# Patient Record
Sex: Male | Born: 1985 | Race: White | Hispanic: No | Marital: Married | Smoking: Former smoker
Health system: Southern US, Community
[De-identification: ages and names within clinical notes are randomized; demographics above are authoritative.]

## PROBLEM LIST (undated history)

## (undated) DIAGNOSIS — N390 Urinary tract infection, site not specified: Secondary | ICD-10-CM

## (undated) HISTORY — PX: NO PAST SURGERIES: SHX2092

## (undated) HISTORY — PX: VASECTOMY: SHX75

---

## 2008-05-25 ENCOUNTER — Emergency Department: Payer: Self-pay | Admitting: Emergency Medicine

## 2011-09-06 ENCOUNTER — Ambulatory Visit: Payer: Self-pay | Admitting: Gastroenterology

## 2021-01-12 ENCOUNTER — Other Ambulatory Visit: Payer: Self-pay

## 2021-01-12 ENCOUNTER — Ambulatory Visit
Admission: EM | Admit: 2021-01-12 | Discharge: 2021-01-12 | Disposition: A | Discharge status: Home / Self Care | Payer: 59 | Attending: Emergency Medicine | Admitting: Emergency Medicine

## 2021-01-12 ENCOUNTER — Encounter: Payer: Self-pay | Admitting: Emergency Medicine

## 2021-01-12 DIAGNOSIS — J020 Streptococcal pharyngitis: Secondary | ICD-10-CM | POA: Insufficient documentation

## 2021-01-12 LAB — GROUP A STREP BY PCR: Group A Strep by PCR: DETECTED — AB

## 2021-01-12 MED ORDER — AMOXICILLIN-POT CLAVULANATE 875-125 MG PO TABS: 1.0000 | ORAL_TABLET | Freq: Two times a day (BID) | ORAL | 0 refills | Status: DC

## 2021-01-12 NOTE — ED Provider Notes (Signed)
[facility]    CSN: [id number] Arrival date & time: 01/12/21  1827      History   Chief Complaint Chief Complaint  Patient presents with  . Sore Throat  . Lymphadenopathy    HPI Douglas Banks is a 35 y.o. male.   HPI   35 year old male here for evaluation of sore throat and swollen glands.  Patient reports that his sore throat started earlier today.  His wife tested positive for strep 2 days ago and had a negative Covid test at that time.  Patient states that he has not had any fever, runny nose, ear pain or pressure, cough, shortness of breath, or GI complaints.  History reviewed. No pertinent past medical history.  There are no problems to display for this patient.   Past Surgical History:  Procedure Laterality Date  . NO PAST SURGERIES         Home Medications    Prior to Admission medications   Medication Sig Start Date End Date Taking? Authorizing Provider  amoxicillin-clavulanate (AUGMENTIN) 875-125 MG tablet Take 1 tablet by mouth every 12 (twelve) hours. 01/12/21  Yes Becky Augusta, NP    Family History Family History  Problem Relation Age of Onset  . Diabetes Mother   . Other Father        unknown medical history    Social History Social History   Tobacco Use  . Smoking status: Former Smoker    Years: 10.00    Types: Cigarettes    Quit date: 01/12/2017    Years since quitting: 4.0  . Smokeless tobacco: Never Used  . Tobacco comment: 1 pack a week  Vaping Use  . Vaping Use: Former  . Quit date: 01/13/2020  Substance Use Topics  . Alcohol use: Yes    Comment: social  . Drug use: Never     Allergies   Patient has no known allergies.   Review of Systems Review of Systems  Constitutional: Negative for activity change, appetite change and fever.  HENT: Positive for sore throat. Negative for congestion, ear pain and rhinorrhea.   Respiratory: Negative for cough and shortness of breath.   Gastrointestinal: Negative for  diarrhea, nausea and vomiting.  Skin: Negative for rash.  Hematological: Negative.   Psychiatric/Behavioral: Negative.      Physical Exam Triage Vital Signs ED Triage Vitals  Enc Vitals Group     BP 01/12/21 1849 (!) 132/93     Pulse Rate 01/12/21 1849 (!) 107     Resp 01/12/21 1849 18     Temp 01/12/21 1849 98.8 F (37.1 C)     Temp Source 01/12/21 1849 Oral     SpO2 01/12/21 1849 99 %     Weight 01/12/21 1850 230 lb (104.3 kg)     Height 01/12/21 1850 6\' 3"  (1.905 m)     Head Circumference --      Peak Flow --      Pain Score 01/12/21 1849 4     Pain Loc --      Pain Edu? --      Excl. in GC? --    No data found.  Updated Vital Signs BP (!) 132/93 (BP Location: Left Arm)   Pulse (!) 107   Temp 98.8 F (37.1 C) (Oral)   Resp 18   Ht 6\' 3"  (1.905 m)   Wt 230 lb (104.3 kg)   SpO2 99%   BMI 28.75 kg/m   Visual Acuity Right Eye Distance:  Left Eye Distance:   Bilateral Distance:    Right Eye Near:   Left Eye Near:    Bilateral Near:     Physical Exam Vitals and nursing note reviewed.  Constitutional:      General: He is not in acute distress.    Appearance: He is well-developed. He is not ill-appearing.  HENT:     Right Ear: Tympanic membrane and ear canal normal. Tympanic membrane is not erythematous.     Left Ear: Tympanic membrane and ear canal normal. Tympanic membrane is not erythematous.     Mouth/Throat:     Mouth: Mucous membranes are moist.     Pharynx: Oropharynx is clear. Posterior oropharyngeal erythema present. No oropharyngeal exudate.     Tonsils: No tonsillar exudate. 2+ on the right. 2+ on the left.  Cardiovascular:     Rate and Rhythm: Normal rate and regular rhythm.     Heart sounds: Normal heart sounds. No murmur heard. No gallop.   Pulmonary:     Effort: Pulmonary effort is normal.     Breath sounds: Normal breath sounds. No wheezing, rhonchi or rales.  Musculoskeletal:     Cervical back: Normal range of motion and neck supple.   Lymphadenopathy:     Cervical: Cervical adenopathy present.  Skin:    General: Skin is warm and dry.     Capillary Refill: Capillary refill takes less than 2 seconds.     Findings: No erythema or rash.  Neurological:     General: No focal deficit present.     Mental Status: He is alert and oriented to person, place, and time.  Psychiatric:        Mood and Affect: Mood normal.        Behavior: Behavior normal.      UC Treatments / Results  Labs (all labs ordered are listed, but only abnormal results are displayed) Labs Reviewed  GROUP A STREP BY PCR - Abnormal; Notable for the following components:      Result Value   Group A Strep by PCR DETECTED (*)    All other components within normal limits    EKG   Radiology No results found.  Procedures Procedures (including critical care time)  Medications Ordered in UC Medications - No data to display  Initial Impression / Assessment and Plan / UC Course  I have reviewed the triage vital signs and the nursing notes.  Pertinent labs & imaging results that were available during my care of the patient were reviewed by me and considered in my medical decision making (see chart for details).   Patient is here for evaluation of sore throat and swollen glands that started on her today.  Patient's wife tested positive for strep 2 days ago.  Physical exam reveals erythematous and edematous tonsillar pillars without exudate.  Patient does have cervical some lymphadenopathy on exam.  Will send strep PCR.  Strep PCR is positive.  We will treat patient with Augmentin twice daily for 7 days, salt water gargles, over-the-counter Advil, and supportive care.   Final Clinical Impressions(s) / UC Diagnoses   Final diagnoses:  Strep throat     Discharge Instructions     Take the Augmentin twice daily with food for 7 days for treatment of your strep throat.  Use over-the-counter Tylenol Advil as needed for pain and fever.  Gargle  with warm salt water 2-3 times a day to aid in healing.  Return for reevaluation, or follow-up with your primary  care provider, for any new or worsening symptoms.    ED Prescriptions    Medication Sig Dispense Auth. Provider   amoxicillin-clavulanate (AUGMENTIN) 875-125 MG tablet Take 1 tablet by mouth every 12 (twelve) hours. 14 tablet Becky Augusta, NP     PDMP not reviewed this encounter.   Becky Augusta, NP 01/12/21 1925

## 2021-01-12 NOTE — Discharge Instructions (Addendum)
Take the Augmentin twice daily with food for 7 days for treatment of your strep throat.  Use over-the-counter Tylenol Advil as needed for pain and fever.  Gargle with warm salt water 2-3 times a day to aid in healing.  Return for reevaluation, or follow-up with your primary care provider, for any new or worsening symptoms.

## 2021-01-12 NOTE — ED Triage Notes (Signed)
Patient in today c/o sore throat and swollen glands x today. Patient states his wife tested positive for strep on 01/10/21. Patient has had the covid vaccines, no booster. Patient has had covid twice the last being Mid-Jan 2022.

## 2021-02-17 ENCOUNTER — Other Ambulatory Visit: Payer: Self-pay

## 2021-02-17 ENCOUNTER — Ambulatory Visit
Admission: RE | Admit: 2021-02-17 | Discharge: 2021-02-17 | Disposition: A | Discharge status: Home / Self Care | Payer: 59 | Source: Ambulatory Visit | Attending: Family Medicine | Admitting: Family Medicine

## 2021-02-17 VITALS — BP 124/88 | HR 97 | Temp 98.3°F | Resp 18 | Ht 75.0 in | Wt 229.9 lb

## 2021-02-17 DIAGNOSIS — M545 Low back pain, unspecified: Secondary | ICD-10-CM

## 2021-02-17 LAB — CHLAMYDIA/NGC RT PCR (ARMC ONLY)
Chlamydia Tr: NOT DETECTED
N gonorrhoeae: NOT DETECTED

## 2021-02-17 LAB — HIV ANTIBODY (ROUTINE TESTING W REFLEX): HIV Screen 4th Generation wRfx: NONREACTIVE

## 2021-02-17 MED ORDER — TIZANIDINE HCL 4 MG PO TABS: 4.0000 mg | ORAL_TABLET | Freq: Three times a day (TID) | ORAL | 0 refills | Status: DC | PRN

## 2021-02-17 MED ORDER — MELOXICAM 15 MG PO TABS: 15.0000 mg | ORAL_TABLET | Freq: Every day | ORAL | 0 refills | Status: DC | PRN

## 2021-02-17 NOTE — ED Provider Notes (Signed)
[facility]    CSN: [id number] Arrival date & time: 02/17/21  1636      History   Chief Complaint Chief Complaint  Patient presents with  . Back Pain   HPI  35 year old male presents with low back pain.  1 week history of low back pain.  Started after he returned to the gym after a long layoff due to COVID-19.  No fall, trauma, or direct injury.  He has been using ice and heat without resolution.  Pain is 7/10 in severity.  Worse with range of motion.  Heat does help but does not resolve his problem.  Likely exacerbated by recent change in activity.  No other complaints at this time.  Home Medications    Prior to Admission medications   Medication Sig Start Date End Date Taking? Authorizing Provider  meloxicam (MOBIC) 15 MG tablet Take 1 tablet (15 mg total) by mouth daily as needed for pain. 02/17/21  Yes Tiyah Zelenak G, DO  tiZANidine (ZANAFLEX) 4 MG tablet Take 1 tablet (4 mg total) by mouth every 8 (eight) hours as needed for muscle spasms. 02/17/21  Yes Tommie Sams, DO    Family History Family History  Problem Relation Age of Onset  . Diabetes Mother   . Other Father        unknown medical history    Social History Social History   Tobacco Use  . Smoking status: Former Smoker    Years: 10.00    Types: Cigarettes    Quit date: 01/12/2017    Years since quitting: 4.1  . Smokeless tobacco: Never Used  . Tobacco comment: 1 pack a week  Vaping Use  . Vaping Use: Former  . Quit date: 01/13/2020  Substance Use Topics  . Alcohol use: Yes    Comment: social  . Drug use: Never     Allergies   Patient has no known allergies.   Review of Systems Review of Systems Per HPI  Physical Exam Triage Vital Signs ED Triage Vitals  Enc Vitals Group     BP 02/17/21 1648 124/88     Pulse Rate 02/17/21 1648 97     Resp 02/17/21 1648 18     Temp 02/17/21 1648 98.3 F (36.8 C)     Temp Source 02/17/21 1648 Oral     SpO2 02/17/21 1648 100 %     Weight  02/17/21 1647 229 lb 15 oz (104.3 kg)     Height 02/17/21 1647 6\' 3"  (1.905 m)     Head Circumference --      Peak Flow --      Pain Score 02/17/21 1647 7     Pain Loc --      Pain Edu? --      Excl. in GC? --    Updated Vital Signs BP 124/88 (BP Location: Left Arm)   Pulse 97   Temp 98.3 F (36.8 C) (Oral)   Resp 18   Ht 6\' 3"  (1.905 m)   Wt 104.3 kg   SpO2 100%   BMI 28.74 kg/m   Visual Acuity Right Eye Distance:   Left Eye Distance:   Bilateral Distance:    Right Eye Near:   Left Eye Near:    Bilateral Near:     Physical Exam Vitals and nursing note reviewed.  Constitutional:      General: He is not in acute distress.    Appearance: Normal appearance. He is not ill-appearing.  HENT:  Head: Normocephalic and atraumatic.  Cardiovascular:     Rate and Rhythm: Normal rate.  Pulmonary:     Effort: Pulmonary effort is normal.     Breath sounds: Normal breath sounds. No wheezing, rhonchi or rales.  Musculoskeletal:     Comments: Left paraspinal muscle spasm noted in the lumbar region.  Decreased range of motion.  Neurological:     Mental Status: He is alert.  Psychiatric:        Mood and Affect: Mood normal.        Behavior: Behavior normal.    UC Treatments / Results  Labs (all labs ordered are listed, but only abnormal results are displayed) Labs Reviewed  CHLAMYDIA/NGC RT PCR ([facility] ONLY)  RPR  HIV ANTIBODY (ROUTINE TESTING W REFLEX)  HCV AB W REFLEX TO QUANT PCR    EKG   Radiology No results found.  Procedures Procedures (including critical care time)  Medications Ordered in UC Medications - No data to display  Initial Impression / Assessment and Plan / UC Course  I have reviewed the triage vital signs and the nursing notes.  Pertinent labs & imaging results that were available during my care of the patient were reviewed by me and considered in my medical decision making (see chart for details).    35 year old male presents  with acute low back pain.  Treating with Zanaflex and meloxicam.  Supportive care.  Final Clinical Impressions(s) / UC Diagnoses   Final diagnoses:  Acute left-sided low back pain without sciatica     Discharge Instructions     Rest.  Heat.  Medications as prescribed.  Take care  Dr. Adriana Simas    ED Prescriptions    Medication Sig Dispense Auth. Provider   tiZANidine (ZANAFLEX) 4 MG tablet Take 1 tablet (4 mg total) by mouth every 8 (eight) hours as needed for muscle spasms. 30 tablet Garrison Michie G, DO   meloxicam (MOBIC) 15 MG tablet Take 1 tablet (15 mg total) by mouth daily as needed for pain. 30 tablet Tommie Sams, DO     PDMP not reviewed this encounter.   Tommie Sams, [place] 02/17/21 [phone]

## 2021-02-17 NOTE — ED Triage Notes (Signed)
Pt c/o lower back pain. Started about a week ago. He states he had went to the gym the day before the pain started but he only did cardio. He has tried otc pain meds and alternating heat and ice.

## 2021-02-17 NOTE — Discharge Instructions (Signed)
Rest.  Heat.  Medications as prescribed.  Take care  Dr. Linzy Laury  

## 2021-02-18 ENCOUNTER — Telehealth: Payer: Self-pay | Admitting: Student in an Organized Health Care Education/Training Program

## 2021-02-18 ENCOUNTER — Emergency Department
Admission: EM | Admit: 2021-02-18 | Discharge: 2021-02-18 | Disposition: A | Discharge status: Home / Self Care | Payer: 59 | Attending: Emergency Medicine | Admitting: Emergency Medicine

## 2021-02-18 ENCOUNTER — Other Ambulatory Visit: Payer: Self-pay

## 2021-02-18 ENCOUNTER — Emergency Department: Payer: 59

## 2021-02-18 ENCOUNTER — Encounter: Payer: Self-pay | Admitting: Emergency Medicine

## 2021-02-18 DIAGNOSIS — R109 Unspecified abdominal pain: Secondary | ICD-10-CM | POA: Diagnosis present

## 2021-02-18 DIAGNOSIS — Z87891 Personal history of nicotine dependence: Secondary | ICD-10-CM | POA: Insufficient documentation

## 2021-02-18 DIAGNOSIS — N12 Tubulo-interstitial nephritis, not specified as acute or chronic: Secondary | ICD-10-CM

## 2021-02-18 DIAGNOSIS — N10 Acute tubulo-interstitial nephritis: Secondary | ICD-10-CM | POA: Insufficient documentation

## 2021-02-18 DIAGNOSIS — N281 Cyst of kidney, acquired: Secondary | ICD-10-CM | POA: Insufficient documentation

## 2021-02-18 DIAGNOSIS — N200 Calculus of kidney: Secondary | ICD-10-CM | POA: Insufficient documentation

## 2021-02-18 LAB — URINALYSIS, COMPLETE (UACMP) WITH MICROSCOPIC
Bilirubin Urine: NEGATIVE
Glucose, UA: NEGATIVE mg/dL
Ketones, ur: 20 mg/dL — AB
Nitrite: NEGATIVE
Protein, ur: 100 mg/dL — AB
RBC / HPF: >50 RBC/hpf — ABNORMAL HIGH (ref 0–5)
Specific Gravity, Urine: 1.025 (ref 1.005–1.030)
WBC, UA: >50 WBC/hpf — ABNORMAL HIGH (ref 0–5)
pH: 5 (ref 5.0–8.0)

## 2021-02-18 LAB — CHLAMYDIA/NGC RT PCR (ARMC ONLY)
Chlamydia Tr: NOT DETECTED
N gonorrhoeae: NOT DETECTED

## 2021-02-18 LAB — CBC WITH DIFFERENTIAL/PLATELET
Abs Immature Granulocytes: 0.04 10*3/uL (ref 0.00–0.07)
Basophils Absolute: 0 10*3/uL (ref 0.0–0.1)
Basophils Relative: 0 %
Eosinophils Absolute: 0 10*3/uL (ref 0.0–0.5)
Eosinophils Relative: 0 %
HCT: 42 % (ref 39.0–52.0)
Hemoglobin: 14.1 g/dL (ref 13.0–17.0)
Immature Granulocytes: 0 %
Lymphocytes Relative: 12 %
Lymphs Abs: 1.7 10*3/uL (ref 0.7–4.0)
MCH: 29.1 pg (ref 26.0–34.0)
MCHC: 33.6 g/dL (ref 30.0–36.0)
MCV: 86.8 fL (ref 80.0–100.0)
Monocytes Absolute: 1.1 10*3/uL — ABNORMAL HIGH (ref 0.1–1.0)
Monocytes Relative: 8 %
Neutro Abs: 11.2 10*3/uL — ABNORMAL HIGH (ref 1.7–7.7)
Neutrophils Relative %: 80 %
Platelets: 191 10*3/uL (ref 150–400)
RBC: 4.84 MIL/uL (ref 4.22–5.81)
RDW: 12.8 % (ref 11.5–15.5)
WBC: 14.1 10*3/uL — ABNORMAL HIGH (ref 4.0–10.5)
nRBC: 0 % (ref 0.0–0.2)

## 2021-02-18 LAB — COMPREHENSIVE METABOLIC PANEL
ALT: 25 U/L (ref 0–44)
AST: 18 U/L (ref 15–41)
Albumin: 4.3 g/dL (ref 3.5–5.0)
Alkaline Phosphatase: 53 U/L (ref 38–126)
Anion gap: 8 (ref 5–15)
BUN: 17 mg/dL (ref 6–20)
CO2: 22 mmol/L (ref 22–32)
Calcium: 8.9 mg/dL (ref 8.9–10.3)
Chloride: 105 mmol/L (ref 98–111)
Creatinine, Ser: 1.08 mg/dL (ref 0.61–1.24)
GFR, Estimated: >60 mL/min (ref >60)
Glucose, Bld: 197 mg/dL — ABNORMAL HIGH (ref 70–99)
Potassium: 3.7 mmol/L (ref 3.5–5.1)
Sodium: 135 mmol/L (ref 135–145)
Total Bilirubin: 1.9 mg/dL — ABNORMAL HIGH (ref 0.3–1.2)
Total Protein: 7.8 g/dL (ref 6.5–8.1)

## 2021-02-18 LAB — HCV INTERPRETATION

## 2021-02-18 LAB — RPR: RPR Ser Ql: NONREACTIVE

## 2021-02-18 LAB — HCV AB W REFLEX TO QUANT PCR: HCV Ab: <0.1 s/co ratio (ref 0.0–0.9)

## 2021-02-18 LAB — HEMOGLOBIN A1C
Hgb A1c MFr Bld: 5.7 % — ABNORMAL HIGH (ref 4.8–5.6)
Mean Plasma Glucose: 116.89 mg/dL

## 2021-02-18 MED ORDER — ONDANSETRON 4 MG PO TBDP: 4.0000 mg | ORAL_TABLET | Freq: Four times a day (QID) | ORAL | 0 refills | Status: DC | PRN

## 2021-02-18 MED ORDER — HYDROCODONE-ACETAMINOPHEN 5-325 MG PO TABS: 1.0000 | ORAL_TABLET | ORAL | 0 refills | Status: DC | PRN

## 2021-02-18 MED ORDER — SODIUM CHLORIDE 0.9 % IV SOLN
1.0000 g | Freq: Once | INTRAVENOUS | Status: AC
  Administered 2021-02-18: 1 g via INTRAVENOUS
  Filled 2021-02-18: qty 10

## 2021-02-18 MED ORDER — SODIUM CHLORIDE 0.9 % IV BOLUS (SEPSIS)
1000.0000 mL | Freq: Once | INTRAVENOUS | Status: AC
  Administered 2021-02-18: 1000 mL via INTRAVENOUS

## 2021-02-18 MED ORDER — IOHEXOL 300 MG/ML  SOLN
125.0000 mL | Freq: Once | INTRAMUSCULAR | Status: AC | PRN
  Administered 2021-02-18: 125 mL via INTRAVENOUS

## 2021-02-18 MED ORDER — ONDANSETRON HCL 4 MG/2ML IJ SOLN
4.0000 mg | Freq: Once | INTRAMUSCULAR | Status: AC
  Administered 2021-02-18: 4 mg via INTRAVENOUS
  Filled 2021-02-18: qty 2

## 2021-02-18 MED ORDER — CEFPODOXIME PROXETIL 200 MG PO TABS: 200.0000 mg | ORAL_TABLET | Freq: Two times a day (BID) | ORAL | 0 refills | Status: DC

## 2021-02-18 MED ORDER — OXYCODONE-ACETAMINOPHEN 5-325 MG PO TABS: 1.0000 | ORAL_TABLET | ORAL | 0 refills | Status: DC | PRN

## 2021-02-18 MED ORDER — KETOROLAC TROMETHAMINE 30 MG/ML IJ SOLN
30.0000 mg | Freq: Once | INTRAMUSCULAR | Status: AC
  Administered 2021-02-18: 30 mg via INTRAVENOUS
  Filled 2021-02-18: qty 1

## 2021-02-18 NOTE — ED Notes (Signed)
Patient provided with ice water per physician request

## 2021-02-18 NOTE — Discharge Instructions (Signed)
You may alternate Tylenol 1000 mg every 6 hours as needed for pain, fever and Ibuprofen 800 mg every 8 hours as needed for pain, fever.  Please take Ibuprofen with food.  Do not take more than 4000 mg of Tylenol (acetaminophen) in a 24 hour period.  Please do not take ibuprofen if you are taking meloxicam.  I recommend that you stop taking your tizanidine.  Please take your antibiotics until complete.

## 2021-02-18 NOTE — ED Notes (Signed)
Patient transported to CT with technician in wheelchair   

## 2021-02-18 NOTE — Telephone Encounter (Signed)
Was contacted by [facility] stating that they are on back order for Norco therefore prescription switched to Percocet

## 2021-02-18 NOTE — ED Provider Notes (Addendum)
[facility] Emergency Department Provider Note  ____________________________________________   Event Date/Time   First MD Initiated Contact with Patient 02/18/21 0401     (approximate)  I have reviewed the triage vital signs and the nursing notes.   HISTORY  Chief Complaint Flank Pain    HPI Douglas Banks is a 35 y.o. male with no significant past medical history who presents to the emergency department left flank pain for the past week.  States he was seen in urgent care and started on tizanidine and meloxicam without much relief.  States he had a fever tonight of 101.8 which prompted him to come to the ED.  Has had nausea without vomiting or diarrhea.  Has had dysuria without hematuria or penile discharge.  No history of previous kidney stones.  No abdominal pain.  No injury to his back.        History reviewed. No pertinent past medical history.  There are no problems to display for this patient.   Past Surgical History:  Procedure Laterality Date  . NO PAST SURGERIES      Prior to Admission medications   Medication Sig Start Date End Date Taking? Authorizing Provider  cefpodoxime (VANTIN) 200 MG tablet Take 1 tablet (200 mg total) by mouth 2 (two) times daily. 02/18/21  Yes Margart Zemanek, Layla Maw, DO  HYDROcodone-acetaminophen (NORCO/VICODIN) 5-325 MG tablet Take 1 tablet by mouth every 4 (four) hours as needed. 02/18/21  Yes Jacek Colson N, DO  ondansetron (ZOFRAN ODT) 4 MG disintegrating tablet Take 1 tablet (4 mg total) by mouth every 6 (six) hours as needed for nausea or vomiting. 02/18/21  Yes Jeannelle Wiens, Layla Maw, DO  meloxicam (MOBIC) 15 MG tablet Take 1 tablet (15 mg total) by mouth daily as needed for pain. 02/17/21   Tommie Sams, DO  tiZANidine (ZANAFLEX) 4 MG tablet Take 1 tablet (4 mg total) by mouth every 8 (eight) hours as needed for muscle spasms. 02/17/21   Tommie Sams, DO    Allergies Patient has no known allergies.  Family  History  Problem Relation Age of Onset  . Diabetes Mother   . Other Father        unknown medical history    Social History Social History   Tobacco Use  . Smoking status: Former Smoker    Years: 10.00    Types: Cigarettes    Quit date: 01/12/2017    Years since quitting: 4.1  . Smokeless tobacco: Never Used  . Tobacco comment: 1 pack a week  Vaping Use  . Vaping Use: Some days  . Last attempt to quit: 01/13/2020  Substance Use Topics  . Alcohol use: Yes    Comment: social  . Drug use: Never    Review of Systems Constitutional: +  fever. Eyes: No visual changes. ENT: No sore throat. Cardiovascular: Denies chest pain. Respiratory: Denies shortness of breath. Gastrointestinal: No vomiting, diarrhea. Genitourinary: + dysuria. Musculoskeletal: Negative for back pain. Skin: Negative for rash. Neurological: Negative for focal weakness or numbness.  ____________________________________________   PHYSICAL EXAM:  VITAL SIGNS: ED Triage Vitals [02/18/21 0212]  Enc Vitals Group     BP 106/86     Pulse Rate (!) 102     Resp 18     Temp 98.6 F (37 C)     Temp Source Oral     SpO2 100 %     Weight 230 lb (104.3 kg)     Height 6\' 3"  (1.905  m)     Head Circumference      Peak Flow      Pain Score 6     Pain Loc      Pain Edu?      Excl. in GC?    CONSTITUTIONAL: Alert and oriented and responds appropriately to questions. Well-appearing; well-nourished, nontoxic-appearing, well-hydrated HEAD: Normocephalic EYES: Conjunctivae clear, pupils appear equal, EOM appear intact ENT: normal nose; moist mucous membranes NECK: Supple, normal ROM CARD: RRR; S1 and S2 appreciated; no murmurs, no clicks, no rubs, no gallops RESP: Normal chest excursion without splinting or tachypnea; breath sounds clear and equal bilaterally; no wheezes, no rhonchi, no rales, no hypoxia or respiratory distress, speaking full sentences ABD/GI: Normal bowel sounds; non-distended; soft, non-tender,  no rebound, no guarding, no peritoneal signs, no hepatosplenomegaly BACK: The back appears normal, no midline spinal tenderness or step-off or deformity, + left CVA tenderness on exam EXT: Normal ROM in all joints; no deformity noted, no edema; no cyanosis SKIN: Normal color for age and race; warm; no rash on exposed skin NEURO: Moves all extremities equally PSYCH: The patient's mood and manner are appropriate.  ____________________________________________   LABS (all labs ordered are listed, but only abnormal results are displayed)  Labs Reviewed  CBC WITH DIFFERENTIAL/PLATELET - Abnormal; Notable for the following components:      Result Value   WBC 14.1 (*)    Neutro Abs 11.2 (*)    Monocytes Absolute 1.1 (*)    All other components within normal limits  COMPREHENSIVE METABOLIC PANEL - Abnormal; Notable for the following components:   Glucose, Bld 197 (*)    Total Bilirubin 1.9 (*)    All other components within normal limits  URINALYSIS, COMPLETE (UACMP) WITH MICROSCOPIC - Abnormal; Notable for the following components:   Color, Urine AMBER (*)    APPearance HAZY (*)    Hgb urine dipstick LARGE (*)    Ketones, ur 20 (*)    Protein, ur 100 (*)    Leukocytes,Ua MODERATE (*)    RBC / HPF >50 (*)    WBC, UA >50 (*)    Bacteria, UA MANY (*)    All other components within normal limits  URINE CULTURE  CHLAMYDIA/NGC RT PCR ([facility] ONLY)   ____________________________________________  EKG  none ____________________________________________  RADIOLOGY I, Rachelle Edwards, personally viewed and evaluated these images (plain radiographs) as part of my medical decision making, as well as reviewing the written report by the radiologist.  ED MD interpretation: No ureteral stone.  Official radiology report(s): CT ABDOMEN PELVIS W CONTRAST  Result Date: 02/18/2021 CLINICAL DATA:  Flank pain, kidney stone suspected Concern for possible pyelonephritis versus infected stone  EXAM: CT ABDOMEN AND PELVIS WITH CONTRAST TECHNIQUE: Multidetector CT imaging of the abdomen and pelvis was performed using the standard protocol following bolus administration of intravenous contrast. CONTRAST:  125mL OMNIPAQUE IOHEXOL 300 MG/ML  SOLN COMPARISON:  X-ray abdomen 09/06/2011 FINDINGS: Lower chest: No acute abnormality. Hepatobiliary: Subcentimeter hypodensities are too small to characterize. Vague hypodensity along the false form ligament likely represents focal fatty infiltration. No gallstones, gallbladder wall thickening, or pericholecystic fluid. No biliary dilatation. Pancreas: No focal lesion. Normal pancreatic contour. No surrounding inflammatory changes. No main pancreatic ductal dilatation. Spleen: Normal in size without focal abnormality. Adrenals/Urinary Tract: No adrenal nodule bilaterally. Bilateral kidneys enhance symmetrically. There is a 2.3 cm fluid density lesion within left kidney likely represents a simple renal cyst. Bilateral 1-2 mm calcified stones. Subcentimeter hypodensities are too  small to characterize. No hydronephrosis. No hydroureter. The urinary bladder is unremarkable. Stomach/Bowel: Stomach is within normal limits. No evidence of bowel wall thickening or dilatation. Stool throughout the ascending and transverse colon. Appendix appears normal. Vascular/Lymphatic: No significant vascular findings are present. No enlarged abdominal or pelvic lymph nodes. Reproductive: Prostate is unremarkable. Other: No intraperitoneal free fluid. No intraperitoneal free gas. No organized fluid collection. Musculoskeletal: No abdominal wall hernia or abnormality. No suspicious lytic or blastic osseous lesions. No acute displaced fracture. Multilevel degenerative changes of the spine. Posterior disc osteophyte formation at the L5-S1 level. IMPRESSION: 1. Nonobstructive bilateral 1-2 mm nephrolithiasis. 2. A 2.3 cm fluid density lesion within the left kidney with associated surrounding  trace fat stranding of unclear etiology. Electronically Signed   By: Tish Frederickson M.D.   On: 02/18/2021 05:15    ____________________________________________   PROCEDURES  Procedure(s) performed (including Critical Care):  Procedures   ____________________________________________   INITIAL IMPRESSION / ASSESSMENT AND PLAN / ED COURSE  As part of my medical decision making, I reviewed the following data within the electronic MEDICAL RECORD NUMBER History obtained from family, Nursing notes reviewed and incorporated, Labs reviewed , Old chart reviewed, Notes from prior ED visits and [place] Controlled Substance Database         Patient here with left-sided flank pain, dysuria and fever.  Labs obtained in triage show leukocytosis 14,000 with left shift.  Creatinine normal.  Urine shows moderate leukocytes, greater than 50 red blood cells, greater than 50 white blood cells and many bacteria.  Concern for pyelonephritis versus infected kidney stone.  We will proceed with CT imaging with IV contrast.  Will give IV fluids, Toradol, Zofran, ceftriaxone.  Urine culture pending.  ED PROGRESS  CT scan shows nonobstructing bilateral nephrolithiasis but no ureterolithiasis, hydronephrosis.  He also has a left-sided renal cyst with some fat stranding.  I suspect that patient has pyelonephritis.  Given he is still well-appearing, tolerating p.o. and feeling much better, I feel it is reasonable to discharge him home on oral antibiotics.  He is also comfortable with this plan.  Have advised that he stop taking tizanidine but can take meloxicam as needed.  Will discharge with prescription of Vicodin, Zofran, Cefpodoxime.  At this time, I do not feel there is any life-threatening condition present. I have reviewed, interpreted and discussed all results (EKG, imaging, lab, urine as appropriate) and exam findings with patient/family. I have reviewed nursing notes and appropriate previous records.  I feel the  patient is safe to be discharged home without further emergent workup and can continue workup as an outpatient as needed. Discussed usual and customary return precautions. Patient/family verbalize understanding and are comfortable with this plan.  Outpatient follow-up has been provided as needed. All questions have been answered.   6:30 AM  Pt's wife is a lab tech mildly elevated glucose.  He has been eating and drinking prior to his arrival to the ED.  They requested a hemoglobin A1c be added onto their labs that have already been drawn and they will follow up with these results through MyChart and with their PCP.  They understand that these results are not going to be managed by the ED providers. ____________________________________________   FINAL CLINICAL IMPRESSION(S) / ED DIAGNOSES  Final diagnoses:  Pyelonephritis     ED Discharge Orders         Ordered    cefpodoxime (VANTIN) 200 MG tablet  2 times daily  02/18/21 0544    ondansetron (ZOFRAN ODT) 4 MG disintegrating tablet  Every 6 hours PRN        02/18/21 0544    HYDROcodone-acetaminophen (NORCO/VICODIN) 5-325 MG tablet  Every 4 hours PRN        02/18/21 0544          *Please note:  Douglas Banks was evaluated in Emergency Department on 02/18/2021 for the symptoms described in the history of present illness. He was evaluated in the context of the global COVID-19 pandemic, which necessitated consideration that the patient might be at risk for infection with the SARS-CoV-2 virus that causes COVID-19. Institutional protocols and algorithms that pertain to the evaluation of patients at risk for COVID-19 are in a state of rapid change based on information released by regulatory bodies including the CDC and federal and state organizations. These policies and algorithms were followed during the patient's care in the ED.  Some ED evaluations and interventions may be delayed as a result of limited staffing during and the  pandemic.*   Note:  This document was prepared using Dragon voice recognition software and may include unintentional dictation errors.   Cynethia Schindler, Layla Maw, DO 02/18/21 0544    Leshawn Houseworth, Layla Maw, DO 02/18/21 [phone]

## 2021-02-18 NOTE — ED Triage Notes (Signed)
Patient ambulatory to triage with steady gait, without difficulty or distress noted; pt reports left flank pain, nonradiating with discomfort upon urinating; seen at [facility] yesterday and dx with muscle strain but was concerned over possible fever tonight; tylenol taken at midnight

## 2021-02-18 NOTE — ED Notes (Signed)
Pt tolerating oral fluids with ease.

## 2021-02-20 ENCOUNTER — Other Ambulatory Visit: Payer: Self-pay

## 2021-02-20 ENCOUNTER — Ambulatory Visit
Admission: EM | Admit: 2021-02-20 | Discharge: 2021-02-20 | Disposition: A | Discharge status: Home / Self Care | Payer: 59 | Attending: Internal Medicine | Admitting: Internal Medicine

## 2021-02-20 DIAGNOSIS — N12 Tubulo-interstitial nephritis, not specified as acute or chronic: Secondary | ICD-10-CM | POA: Diagnosis not present

## 2021-02-20 DIAGNOSIS — N200 Calculus of kidney: Secondary | ICD-10-CM | POA: Diagnosis not present

## 2021-02-20 LAB — CBC WITH DIFFERENTIAL/PLATELET
Abs Immature Granulocytes: 0.03 10*3/uL (ref 0.00–0.07)
Basophils Absolute: 0 10*3/uL (ref 0.0–0.1)
Basophils Relative: 0 %
Eosinophils Absolute: 0 10*3/uL (ref 0.0–0.5)
Eosinophils Relative: 0 %
HCT: 43 % (ref 39.0–52.0)
Hemoglobin: 14.7 g/dL (ref 13.0–17.0)
Immature Granulocytes: 0 %
Lymphocytes Relative: 11 %
Lymphs Abs: 1.2 10*3/uL (ref 0.7–4.0)
MCH: 28.9 pg (ref 26.0–34.0)
MCHC: 34.2 g/dL (ref 30.0–36.0)
MCV: 84.6 fL (ref 80.0–100.0)
Monocytes Absolute: 0.7 10*3/uL (ref 0.1–1.0)
Monocytes Relative: 7 %
Neutro Abs: 8.3 10*3/uL — ABNORMAL HIGH (ref 1.7–7.7)
Neutrophils Relative %: 82 %
Platelets: 163 10*3/uL (ref 150–400)
RBC: 5.08 MIL/uL (ref 4.22–5.81)
RDW: 12.7 % (ref 11.5–15.5)
WBC: 10.2 10*3/uL (ref 4.0–10.5)
nRBC: 0 % (ref 0.0–0.2)

## 2021-02-20 LAB — URINALYSIS, COMPLETE (UACMP) WITH MICROSCOPIC
Bilirubin Urine: NEGATIVE
Glucose, UA: NEGATIVE mg/dL
Nitrite: NEGATIVE
Protein, ur: 100 mg/dL — AB
Specific Gravity, Urine: 1.025 (ref 1.005–1.030)
WBC, UA: >50 WBC/hpf (ref 0–5)
pH: 6 (ref 5.0–8.0)

## 2021-02-20 MED ORDER — TAMSULOSIN HCL 0.4 MG PO CAPS: 0.4000 mg | ORAL_CAPSULE | Freq: Every day | ORAL | 0 refills | Status: DC

## 2021-02-20 NOTE — Discharge Instructions (Addendum)
Urinate through the strainer to catch the stone  and can be analgized at the lab. You mat take it to your future primary care provider to get  the order. I am placing you on Flomax to help you pass the stone, take one a day The infection is getting better, so keep on taking the antibiotic.  I also have a Urologist listing you can follow up next week if the stone does not pass and you are still in pain.

## 2021-02-20 NOTE — ED Triage Notes (Signed)
Pt dx with kidney stones and pyelonephritis by [facility] ED on 03/16.  Has been taking oral abx and oral pain meds.  Fever at home continues to run 101+ today.  No improvement at all with meds.  Reports ongoing fatigue, HA, L flank pain, night sweats, etc.  Pain currently 4 with meds on board but goes to 8/10 without meds.   Had Tylenol approx 1700.

## 2021-02-20 NOTE — ED Provider Notes (Signed)
[facility]    CSN: [id number] Arrival date & time: 02/20/21  1754      History   Chief Complaint Chief Complaint  Patient presents with   Flank Pain    HPI Douglas Banks is a 35 y.o. male who presents due to still having fever at home and L flank pain. Was diagnosed with pyelo and renal stone on 3/16 at [facility] and was given Rocephin IM, and sent home with Vantin antibiotic.  He does not have a PCP yet.   History reviewed. No pertinent past medical history.  There are no problems to display for this patient.   Past Surgical History:  Procedure Laterality Date   NO PAST SURGERIES         Home Medications    Prior to Admission medications   Medication Sig Start Date End Date Taking? Authorizing Provider  meloxicam (MOBIC) 15 MG tablet Take 1 tablet (15 mg total) by mouth daily as needed for pain. 02/17/21  Yes Cook, Jayce G, DO  tamsulosin (FLOMAX) 0.4 MG CAPS capsule Take 1 capsule (0.4 mg total) by mouth daily. 02/20/21  Yes Rodriguez-Southworth, Nettie Elm, PA-C  tiZANidine (ZANAFLEX) 4 MG tablet Take 1 tablet (4 mg total) by mouth every 8 (eight) hours as needed for muscle spasms. 02/17/21  Yes Cook, Jayce G, DO  cefpodoxime (VANTIN) 200 MG tablet Take 1 tablet (200 mg total) by mouth 2 (two) times daily. 02/18/21   Ward, Layla Maw, DO  ondansetron (ZOFRAN ODT) 4 MG disintegrating tablet Take 1 tablet (4 mg total) by mouth every 6 (six) hours as needed for nausea or vomiting. 02/18/21   Ward, Layla Maw, DO  oxyCODONE-acetaminophen (PERCOCET) 5-325 MG tablet Take 1 tablet by mouth every 4 (four) hours as needed for severe pain. 02/18/21 02/18/22  Willy Eddy, MD    Family History Family History  Problem Relation Age of Onset   Diabetes Mother    Other Father        unknown medical history    Social History Social History   Tobacco Use   Smoking status: Former Smoker    Years: 10.00    Types: Cigarettes    Quit date: 01/12/2017    Years since  quitting: 4.1   Smokeless tobacco: Never Used   Tobacco comment: 1 pack a week  Vaping Use   Vaping Use: Some days   Last attempt to quit: 01/13/2020   Substances: Nicotine, Flavoring  Substance Use Topics   Alcohol use: Yes    Comment: social   Drug use: Never     Allergies   Patient has no known allergies.   Review of Systems Review of Systems  Constitutional: Positive for fever.  HENT: Negative for congestion, ear discharge, ear pain, postnasal drip and rhinorrhea.   Respiratory: Negative for cough and chest tightness.   Cardiovascular: Negative for chest pain.  Gastrointestinal: Positive for nausea. Negative for abdominal pain, diarrhea and vomiting.  Genitourinary: Positive for flank pain. Negative for dysuria, frequency and hematuria.  Musculoskeletal: Positive for back pain.  Skin: Negative for rash.     Physical Exam Triage Vital Signs ED Triage Vitals  Enc Vitals Group     BP 02/20/21 1810 124/80     Pulse Rate 02/20/21 1810 (!) 118     Resp 02/20/21 1810 18     Temp 02/20/21 1810 98.8 F (37.1 C)     Temp Source 02/20/21 1810 Oral     SpO2 02/20/21 1810 95 %  Weight --      Height --      Head Circumference --      Peak Flow --      Pain Score 02/20/21 1805 4     Pain Loc --      Pain Edu? --      Excl. in GC? --    No data found.  Updated Vital Signs BP 124/80 (BP Location: Left Arm)    Pulse (!) 118    Temp 98.8 F (37.1 C) (Oral)    Resp 18    SpO2 95%   Visual Acuity Right Eye Distance:   Left Eye Distance:   Bilateral Distance:    Right Eye Near:   Left Eye Near:    Bilateral Near:     Physical Exam Vitals and nursing note reviewed.  Constitutional:      General: He is not in acute distress.    Appearance: He is not toxic-appearing.  HENT:     Head: Normocephalic.     Right Ear: External ear normal.     Left Ear: External ear normal.  Eyes:     General: No scleral icterus.    Conjunctiva/sclera: Conjunctivae normal.   Cardiovascular:     Rate and Rhythm: Regular rhythm.  Abdominal:     General: Bowel sounds are normal.     Palpations: Abdomen is soft.     Tenderness: There is left CVA tenderness. There is no right CVA tenderness.     Comments: Has mild L mid abd pain  Musculoskeletal:        General: Normal range of motion.     Cervical back: Neck supple.  Skin:    General: Skin is warm and dry.     Findings: No rash.  Neurological:     Mental Status: He is alert and oriented to person, place, and time.     Gait: Gait normal.  Psychiatric:        Mood and Affect: Mood normal.        Behavior: Behavior normal.        Thought Content: Thought content normal.        Judgment: Judgment normal.     UC Treatments / Results  Labs (all labs ordered are listed, but only abnormal results are displayed) Labs Reviewed  CBC WITH DIFFERENTIAL/PLATELET - Abnormal; Notable for the following components:      Result Value   Neutro Abs 8.3 (*)    All other components within normal limits  URINALYSIS, COMPLETE (UACMP) WITH MICROSCOPIC - Abnormal; Notable for the following components:   APPearance HAZY (*)    Hgb urine dipstick MODERATE (*)    Ketones, ur TRACE (*)    Protein, ur 100 (*)    Leukocytes,Ua TRACE (*)    Bacteria, UA FEW (*)    All other components within normal limits    EKG   Radiology No results found.  Procedures Procedures (including critical care time)  Medications Ordered in UC Medications - No data to display  Initial Impression / Assessment and Plan / UC Course  I have reviewed the triage vital signs and the nursing notes. Pertinent labs  results that were available during my care of the patient were reviewed by me and considered in my medical decision making (see chart for details). His CBC and UA has improved compared to yesterday. I placed him on Flomax to help him pass the stone. Needs to FU with Urology next week. And  needs to finish the antibiotic he is taking.    Final Clinical Impressions(s) / UC Diagnoses   Final diagnoses:  Pyelonephritis  Renal stone     Discharge Instructions     Urinate through the strainer to catch the stone  and can be analgized at the lab. You mat take it to your future primary care provider to get  the order. I am placing you on Flomax to help you pass the stone, take one a day The infection is getting better, so keep on taking the antibiotic.  I also have a Urologist listing you can follow up next week if the stone does not pass and you are still in pain.     ED Prescriptions    Medication Sig Dispense Auth. Provider   tamsulosin (FLOMAX) 0.4 MG CAPS capsule Take 1 capsule (0.4 mg total) by mouth daily. 30 capsule Rodriguez-Southworth, Nettie Elm, [place]     I have reviewed the PDMP during this encounter.   Garey Ham, [place] 02/20/21 1911

## 2021-02-21 LAB — URINE CULTURE: Culture: >=100000 — AB

## 2021-02-22 NOTE — Progress Notes (Signed)
ED Antimicrobial Stewardship Positive Culture Follow Up   Douglas Banks is an 35 y.o. male who presented to [facility] on 02/18/2021 with a chief complaint of  Chief Complaint  Patient presents with  . Flank Pain    Recent Results (from the past 720 hour(s))  Chlamydia/NGC rt PCR ([facility] only)     Status: None   Collection Time: 02/17/21  5:13 PM   Specimen: Urine  Result Value Ref Range Status   Specimen source GC/Chlam URINE, RANDOM  Final    Comment: Performed at [facility], [address], [place], [place] [address]   Chlamydia Tr NOT DETECTED NOT DETECTED Final   N gonorrhoeae NOT DETECTED NOT DETECTED Final    Comment: (NOTE) This CT/NG assay has not been evaluated in patients with a history of  hysterectomy. Performed at [facility], [address] [place], [place] [address]   Urine Culture     Status: Abnormal   Collection Time: 02/18/21  2:15 AM   Specimen: Urine, Random  Result Value Ref Range Status   Specimen Description   Final    URINE, RANDOM Performed at [facility], [address] [place], [place] [address]    Special Requests   Final    NONE Performed at [facility], [address] [place], [place] [address]    Culture >=100,000 COLONIES/mL ENTEROBACTER AEROGENES (A)  Final   Report Status 02/21/2021 FINAL  Final   Organism ID, Bacteria ENTEROBACTER AEROGENES (A)  Final      Susceptibility   Enterobacter aerogenes - MIC*    CEFAZOLIN >=64 RESISTANT Resistant     CEFEPIME <=0.12 SENSITIVE Sensitive     CEFTRIAXONE 32 RESISTANT Resistant     CIPROFLOXACIN <=0.25 SENSITIVE Sensitive     GENTAMICIN <=1 SENSITIVE Sensitive     IMIPENEM 1 SENSITIVE Sensitive     NITROFURANTOIN 64 INTERMEDIATE Intermediate     TRIMETH/SULFA <=20 SENSITIVE Sensitive     PIP/TAZO >=128 RESISTANT Resistant     * >=100,000 COLONIES/mL ENTEROBACTER AEROGENES  Chlamydia/NGC rt PCR ([facility] only)     Status: None   Collection  Time: 02/18/21  2:15 AM   Specimen: Urine  Result Value Ref Range Status   Specimen source GC/Chlam URINE, RANDOM  Final   Chlamydia Tr NOT DETECTED NOT DETECTED Final   N gonorrhoeae NOT DETECTED NOT DETECTED Final    Comment: (NOTE) This CT/NG assay has not been evaluated in patients with a history of  hysterectomy. Performed at [facility], [address] [place], [place] [address]     [x]  Treated with cefpodoxime, organism resistant to prescribed antimicrobial  New antibiotic prescription: Bactrim 1 DS BID x 14 days  ED Provider: Dr. 02/22/2021, 1:57 PM

## 2021-05-23 ENCOUNTER — Other Ambulatory Visit: Payer: Self-pay

## 2021-05-23 ENCOUNTER — Ambulatory Visit: Admission: EM | Admit: 2021-05-23 | Discharge: 2021-05-23 | Discharge status: Against Medical Advice | Payer: 59

## 2021-05-23 ENCOUNTER — Encounter: Payer: Self-pay | Admitting: Emergency Medicine

## 2021-05-23 ENCOUNTER — Emergency Department
Admission: EM | Admit: 2021-05-23 | Discharge: 2021-05-23 | Disposition: A | Discharge status: Home / Self Care | Payer: 59 | Attending: Emergency Medicine | Admitting: Emergency Medicine

## 2021-05-23 ENCOUNTER — Emergency Department: Payer: 59

## 2021-05-23 DIAGNOSIS — N3001 Acute cystitis with hematuria: Secondary | ICD-10-CM | POA: Diagnosis not present

## 2021-05-23 DIAGNOSIS — M542 Cervicalgia: Secondary | ICD-10-CM | POA: Diagnosis present

## 2021-05-23 DIAGNOSIS — Z87891 Personal history of nicotine dependence: Secondary | ICD-10-CM | POA: Diagnosis not present

## 2021-05-23 DIAGNOSIS — R590 Localized enlarged lymph nodes: Secondary | ICD-10-CM | POA: Diagnosis not present

## 2021-05-23 LAB — COMPREHENSIVE METABOLIC PANEL
ALT: 19 U/L (ref 0–44)
AST: 16 U/L (ref 15–41)
Albumin: 4.2 g/dL (ref 3.5–5.0)
Alkaline Phosphatase: 51 U/L (ref 38–126)
Anion gap: 6 (ref 5–15)
BUN: 14 mg/dL (ref 6–20)
CO2: 27 mmol/L (ref 22–32)
Calcium: 9.5 mg/dL (ref 8.9–10.3)
Chloride: 105 mmol/L (ref 98–111)
Creatinine, Ser: 0.9 mg/dL (ref 0.61–1.24)
GFR, Estimated: >60 mL/min (ref >60)
Glucose, Bld: 104 mg/dL — ABNORMAL HIGH (ref 70–99)
Potassium: 4.1 mmol/L (ref 3.5–5.1)
Sodium: 138 mmol/L (ref 135–145)
Total Bilirubin: 0.9 mg/dL (ref 0.3–1.2)
Total Protein: 8.2 g/dL — ABNORMAL HIGH (ref 6.5–8.1)

## 2021-05-23 LAB — CBC WITH DIFFERENTIAL/PLATELET
Abs Immature Granulocytes: 0.03 10*3/uL (ref 0.00–0.07)
Basophils Absolute: 0.1 10*3/uL (ref 0.0–0.1)
Basophils Relative: 1 %
Eosinophils Absolute: 0 10*3/uL (ref 0.0–0.5)
Eosinophils Relative: 1 %
HCT: 41.6 % (ref 39.0–52.0)
Hemoglobin: 13.8 g/dL (ref 13.0–17.0)
Immature Granulocytes: 0 %
Lymphocytes Relative: 23 %
Lymphs Abs: 1.9 10*3/uL (ref 0.7–4.0)
MCH: 28.8 pg (ref 26.0–34.0)
MCHC: 33.2 g/dL (ref 30.0–36.0)
MCV: 86.7 fL (ref 80.0–100.0)
Monocytes Absolute: 0.5 10*3/uL (ref 0.1–1.0)
Monocytes Relative: 5 %
Neutro Abs: 5.9 10*3/uL (ref 1.7–7.7)
Neutrophils Relative %: 70 %
Platelets: 242 10*3/uL (ref 150–400)
RBC: 4.8 MIL/uL (ref 4.22–5.81)
RDW: 13.2 % (ref 11.5–15.5)
WBC: 8.4 10*3/uL (ref 4.0–10.5)
nRBC: 0 % (ref 0.0–0.2)

## 2021-05-23 LAB — URINALYSIS, COMPLETE (UACMP) WITH MICROSCOPIC
Bilirubin Urine: NEGATIVE
Glucose, UA: NEGATIVE mg/dL
Ketones, ur: 5 mg/dL — AB
Nitrite: NEGATIVE
Protein, ur: NEGATIVE mg/dL
Specific Gravity, Urine: 1.016 (ref 1.005–1.030)
Squamous Epithelial / HPF: NONE SEEN (ref 0–5)
pH: 5 (ref 5.0–8.0)

## 2021-05-23 IMAGING — CT CT NECK W/ CM
4 series · 15 of 33 positions shown, 18 images · IV contrast (omnipaque)
Comparison: None.

CLINICAL DATA: Right-sided neck mass. Tender nodule along the
posterior aspect of the right side of the neck noted today.

EXAM:
CT NECK WITH CONTRAST
TECHNIQUE: Multidetector CT imaging of the neck was performed using the
standard protocol following the bolus administration of intravenous
contrast.
CONTRAST:  75mL OMNIPAQUE IOHEXOL 300 MG/ML  SOLN

[Series 2: axial neck · axial · 0.68mm/px · z∈[-299,-115]mm · 5 of 138 slices shown, 7 images]
[im 23/138  soft-tissue]
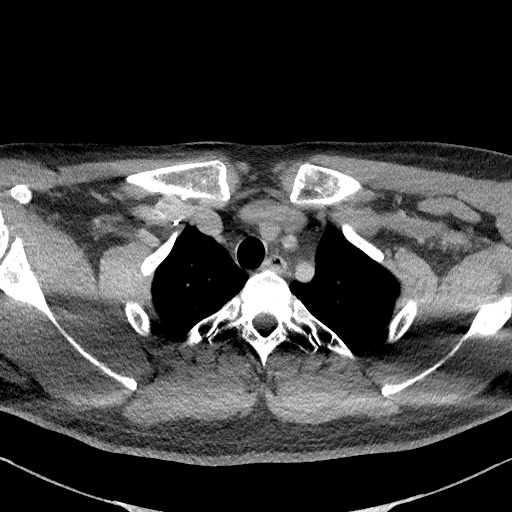
[im 23/138  bone]
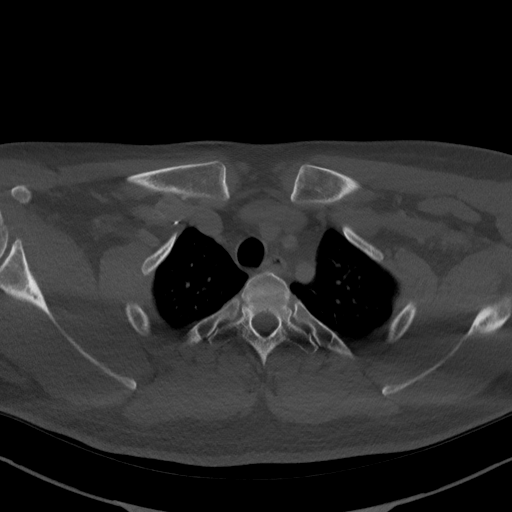
[im 46/138  bone]
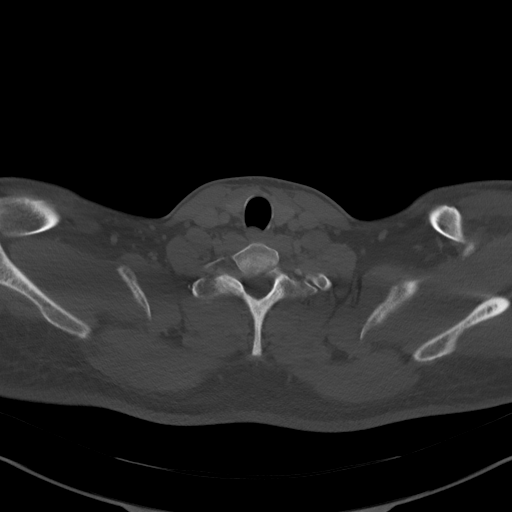
[im 69/138  bone]
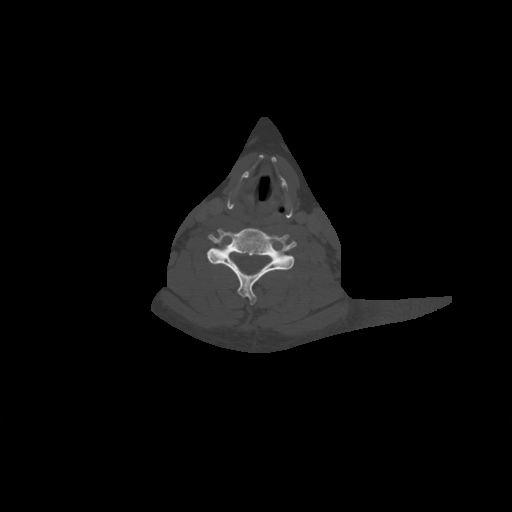
[im 92/138  bone]
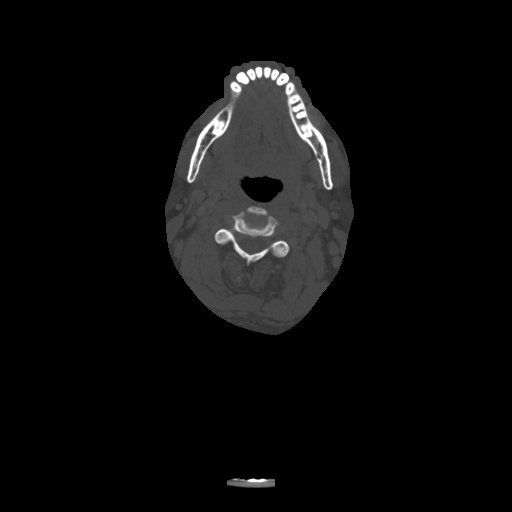
[im 115/138  soft-tissue]
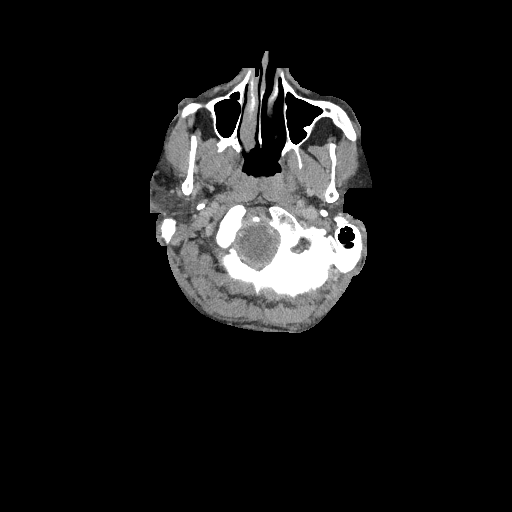
[im 115/138  bone]
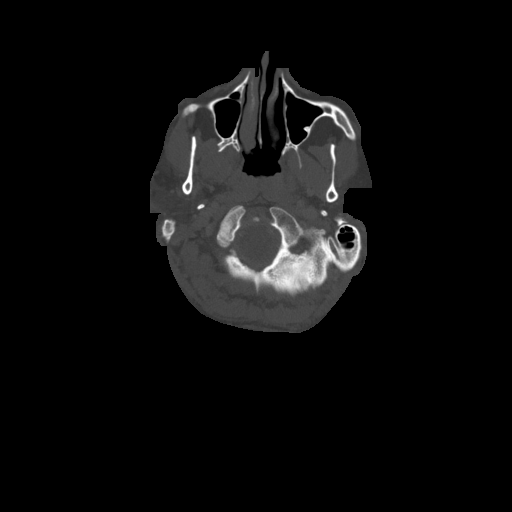

[Series 5: sag neck · sagittal · 0.54mm/px · 5 of 117 slices shown, 6 images]
[im 39/117  bone]
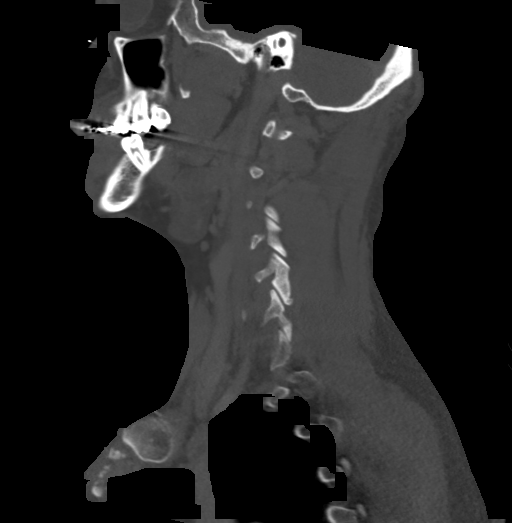
[im 49/117  bone]
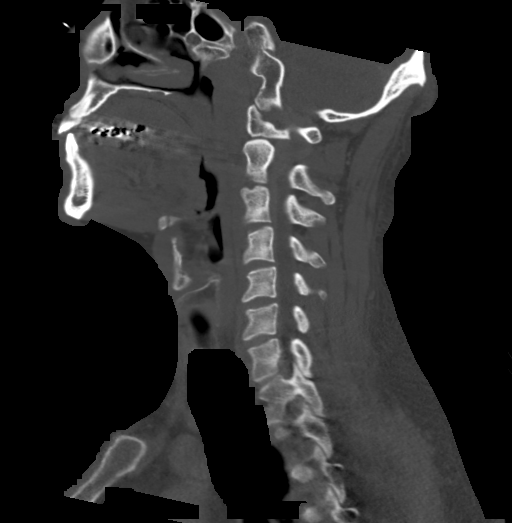
[im 59/117  soft-tissue]
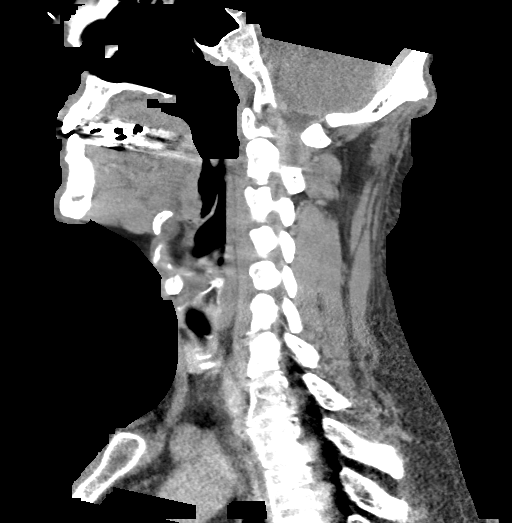
[im 59/117  bone]
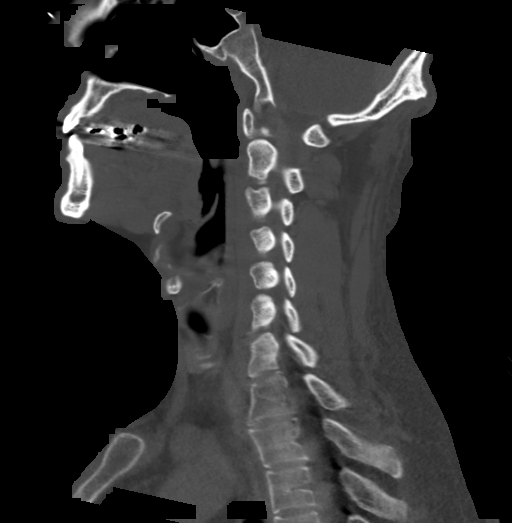
[im 68/117  bone]
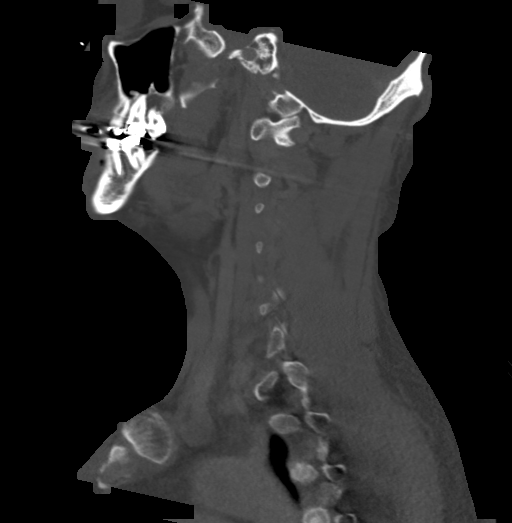
[im 78/117  bone]
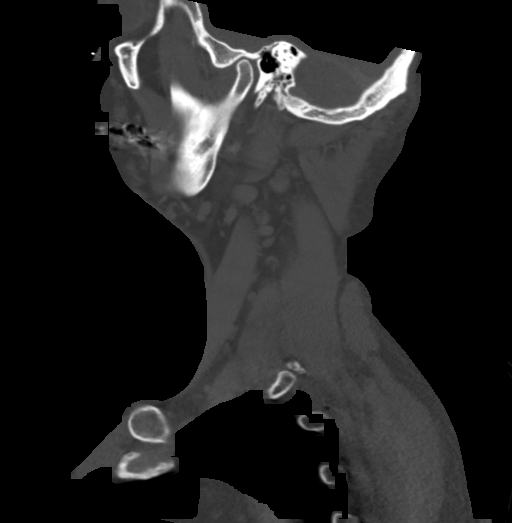

[Series 6: cor neck · coronal · 0.46mm/px · 3 of 136 slices shown]
[im 35/136  bone]
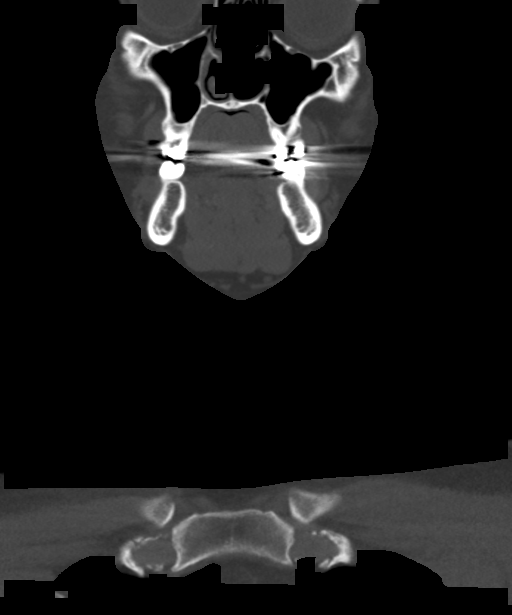
[im 57/136  bone]
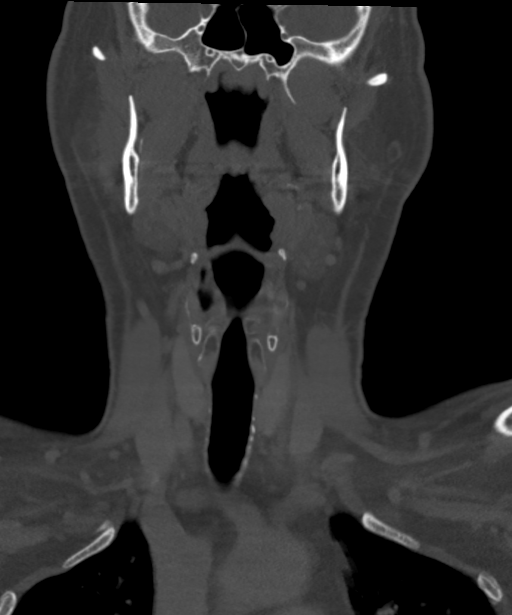
[im 79/136  bone]
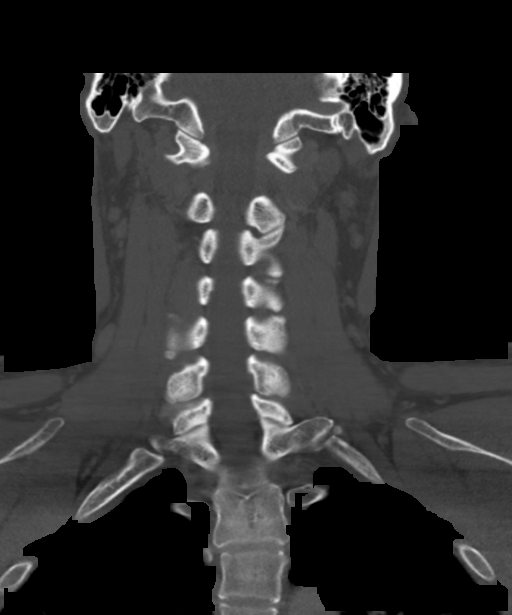

[Series 7: orthogonal ax · axial · 0.46mm/px · z∈[-321,-276]mm · 2 of 141 slices shown]
[im 24/141  bone]
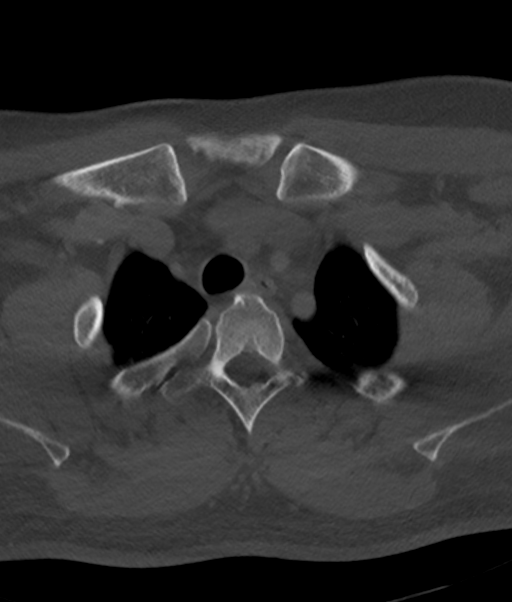
[im 47/141  bone]
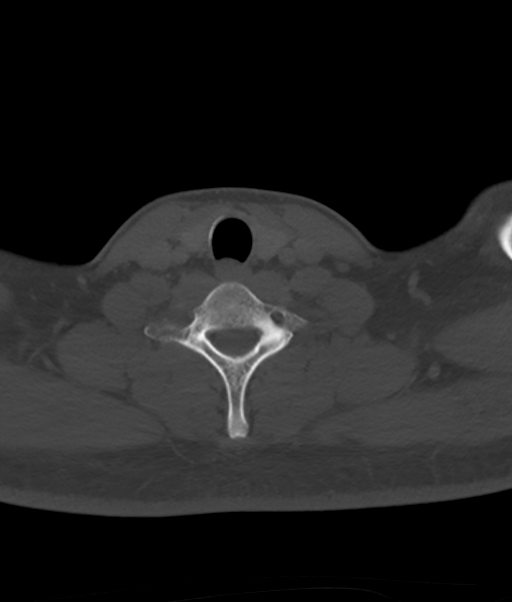

[15 of 33 positions shown; findings below may reference images not displayed]

FINDINGS: Pharynx and larynx: No focal mucosal or submucosal lesions are
present. The nasopharynx is clear. Soft palate and tongue base are
within normal limits. Oropharynx is unremarkable. Palatine tonsils
are within normal limits. The parapharyngeal fat is clear. Vallecula
and epiglottis are within normal limits. Aryepiglottic folds and
piriform sinuses are clear. Vocal cords are midline and symmetric.
Trachea is clear.

Salivary glands: The submandibular and parotid glands and ducts are
within normal limits.

Thyroid: Normal

Lymph nodes: Bilateral reactive type lymph nodes are present. The
largest right-sided node corresponds to the palpable area, a
posterior level 3 lymph node. The node measures 17 x 10 mm. Enlarged
ovoid level 2 lymph nodes are present bilaterally.

Vascular: No significant vascular lesions are present.

Limited intracranial: Unremarkable

Visualized orbits: The globes and orbits are within normal limits.

Mastoids and visualized paranasal sinuses: The paranasal sinuses and
mastoid air cells are clear.

Skeleton: Vertebral body heights alignment are normal. No focal
lytic or blastic lesions are present.

Upper chest: Lung apices are clear. Thoracic inlet is within normal
limits.
IMPRESSION: 1. Bilateral reactive type lymph nodes in the neck. The largest
right-sided node corresponds to the palpable area, a posterior level
3 lymph node. The node measures 17 x 10 mm.
2. No focal mass lesion or primary to suggest malignancy.

## 2021-05-23 MED ORDER — CIPROFLOXACIN HCL 500 MG PO TABS: 500.0000 mg | ORAL_TABLET | Freq: Two times a day (BID) | ORAL | 0 refills | Status: AC

## 2021-05-23 MED ORDER — CIPROFLOXACIN HCL 500 MG PO TABS
500.0000 mg | ORAL_TABLET | Freq: Once | ORAL | Status: AC
  Administered 2021-05-23: 500 mg via ORAL
  Filled 2021-05-23: qty 1

## 2021-05-23 MED ORDER — IOHEXOL 300 MG/ML  SOLN
75.0000 mL | Freq: Once | INTRAMUSCULAR | Status: AC | PRN
  Administered 2021-05-23: 75 mL via INTRAVENOUS
  Filled 2021-05-23: qty 75

## 2021-05-23 NOTE — Discharge Instructions (Addendum)
Take antibiotics as prescribed. Return to the ER with any worsening of symptoms. Otherwise, follow up with your primary care as scheduled for Monday.

## 2021-05-23 NOTE — ED Provider Notes (Signed)
[facility] Emergency Department Provider Note  ____________________________________________   Event Date/Time   First MD Initiated Contact with Patient 05/23/21 1449     (approximate)  I have reviewed the triage vital signs and the nursing notes.   HISTORY  Chief Complaint Cyst   HPI Douglas Banks is a 35 y.o. male who presents to the ER for evaluation of multiple complaints. First, he is complaining of a "knot" that showed up on his right side of his neck this morning. It is reportedly tender to touch and worse with ROM of his neck. He has not tried any alleviating measures. He reports sore throat 2-3 days ago that self resolved without residual discomfort. He denies any fevers, chills, nasal congestion or other illness symptoms associated.  Secondarily, patient states he was seen in March of this year and treated for UTI, then had antibiotics changed several days later. He reports no further abdominal or back pain but has noticed that his urine continues to be malodorous. He denies dysuria, nausea or vomiting associated.        History reviewed. No pertinent past medical history.  There are no problems to display for this patient.   Past Surgical History:  Procedure Laterality Date   NO PAST SURGERIES      Prior to Admission medications   Medication Sig Start Date End Date Taking? Authorizing Provider  ciprofloxacin (CIPRO) 500 MG tablet Take 1 tablet (500 mg total) by mouth 2 (two) times daily for 7 days. 05/23/21 05/30/21 Yes Gertude Benito, Ruben Gottronaitlin J, PA  cefpodoxime (VANTIN) 200 MG tablet Take 1 tablet (200 mg total) by mouth 2 (two) times daily. 02/18/21   Ward, Layla MawKristen N, DO  meloxicam (MOBIC) 15 MG tablet Take 1 tablet (15 mg total) by mouth daily as needed for pain. 02/17/21   Tommie Samsook, Jayce G, DO  ondansetron (ZOFRAN ODT) 4 MG disintegrating tablet Take 1 tablet (4 mg total) by mouth every 6 (six) hours as needed for nausea or vomiting. 02/18/21    Ward, Layla MawKristen N, DO  oxyCODONE-acetaminophen (PERCOCET) 5-325 MG tablet Take 1 tablet by mouth every 4 (four) hours as needed for severe pain. 02/18/21 02/18/22  Willy Eddyobinson, Patrick, MD  tamsulosin (FLOMAX) 0.4 MG CAPS capsule Take 1 capsule (0.4 mg total) by mouth daily. 02/20/21   Rodriguez-Southworth, Nettie ElmSylvia, PA-C    Allergies Patient has no known allergies.  Family History  Problem Relation Age of Onset   Diabetes Mother    Other Father        unknown medical history    Social History Social History   Tobacco Use   Smoking status: Former    Years: 10.00    Pack years: 0.00    Types: Cigarettes    Quit date: 01/12/2017    Years since quitting: 4.3   Smokeless tobacco: Never   Tobacco comments:    1 pack a week  Vaping Use   Vaping Use: Some days   Last attempt to quit: 01/13/2020   Substances: Nicotine, Flavoring  Substance Use Topics   Alcohol use: Yes    Comment: social   Drug use: Never    Review of Systems Constitutional: No fever/chills Eyes: No visual changes. ENT: +right sided neck pain, No sore throat currently, but did have 2-3 days ago Cardiovascular: Denies chest pain. Respiratory: Denies shortness of breath. Gastrointestinal: No abdominal pain.  No nausea, no vomiting.  No diarrhea.  No constipation. Genitourinary: +malodorous urine, Negative for dysuria or flank pain  Musculoskeletal: Negative for back pain. Skin: Negative for rash. Neurological: Negative for headaches, focal weakness or numbness.  ____________________________________________   PHYSICAL EXAM:  VITAL SIGNS: ED Triage Vitals  Enc Vitals Group     BP 05/23/21 1348 129/87     Pulse Rate 05/23/21 1348 (!) 113     Resp 05/23/21 1348 20     Temp 05/23/21 1348 98.6 F (37 C)     Temp Source 05/23/21 1348 Oral     SpO2 05/23/21 1348 96 %     Weight 05/23/21 1349 235 lb (106.6 kg)     Height 05/23/21 1349 6\' 3"  (1.905 m)     Head Circumference --      Peak Flow --      Pain Score  05/23/21 1348 2     Pain Loc --      Pain Edu? --      Excl. in GC? --    Constitutional: Alert and oriented. Well appearing and in no acute distress. Eyes: Conjunctivae are normal. PERRL. EOMI. Head: Atraumatic. Nose: No congestion/rhinnorhea. Mouth/Throat: Mucous membranes are moist.  Oropharynx non-erythematous. Neck: No stridor. There is palpable soft tissue structure that feels approximately 2cmx2cm that is approximately 3-4cm posterior to the SCM on the right. It is tender and mobile, no appreciable lymphadenopathy of the anterior or remaining posterior cervical chain bilaterally. Cardiovascular: Normal rate, regular rhythm. Grossly normal heart sounds.  Good peripheral circulation. Respiratory: Normal respiratory effort.  No retractions. Lungs CTAB. Gastrointestinal: Soft and nontender. No distention. No abdominal bruits. No CVA tenderness. Musculoskeletal: No lower extremity tenderness nor edema.  No joint effusions. Neurologic:  Normal speech and language. No gross focal neurologic deficits are appreciated. No gait instability. Skin:  Skin is warm, dry and intact. No rash noted. Psychiatric: Mood and affect are normal. Speech and behavior are normal.  ____________________________________________   LABS (all labs ordered are listed, but only abnormal results are displayed)  Labs Reviewed  COMPREHENSIVE METABOLIC PANEL - Abnormal; Notable for the following components:      Result Value   Glucose, Bld 104 (*)    Total Protein 8.2 (*)    All other components within normal limits  URINALYSIS, COMPLETE (UACMP) WITH MICROSCOPIC - Abnormal; Notable for the following components:   Color, Urine YELLOW (*)    APPearance CLEAR (*)    Hgb urine dipstick MODERATE (*)    Ketones, ur 5 (*)    Leukocytes,Ua TRACE (*)    Bacteria, UA FEW (*)    All other components within normal limits  URINE CULTURE  CBC WITH DIFFERENTIAL/PLATELET    ____________________________________________  RADIOLOGY  Official radiology report(s): CT Soft Tissue Neck W Contrast  Result Date: 05/23/2021 CLINICAL DATA:  Right-sided neck mass. Tender nodule along the posterior aspect of the right side of the neck noted today. EXAM: CT NECK WITH CONTRAST TECHNIQUE: Multidetector CT imaging of the neck was performed using the standard protocol following the bolus administration of intravenous contrast. CONTRAST:  23mL OMNIPAQUE IOHEXOL 300 MG/ML  SOLN COMPARISON:  None. FINDINGS: Pharynx and larynx: No focal mucosal or submucosal lesions are present. The nasopharynx is clear. Soft palate and tongue base are within normal limits. Oropharynx is unremarkable. Palatine tonsils are within normal limits. The parapharyngeal fat is clear. Vallecula and epiglottis are within normal limits. Aryepiglottic folds and piriform sinuses are clear. Vocal cords are midline and symmetric. Trachea is clear. Salivary glands: The submandibular and parotid glands and ducts are within normal limits. Thyroid: Normal  Lymph nodes: Bilateral reactive type lymph nodes are present. The largest right-sided node corresponds to the palpable area, a posterior level 3 lymph node. The node measures 17 x 10 mm. Enlarged ovoid level 2 lymph nodes are present bilaterally. Vascular: No significant vascular lesions are present. Limited intracranial: Unremarkable Visualized orbits: The globes and orbits are within normal limits. Mastoids and visualized paranasal sinuses: The paranasal sinuses and mastoid air cells are clear. Skeleton: Vertebral body heights alignment are normal. No focal lytic or blastic lesions are present. Upper chest: Lung apices are clear. Thoracic inlet is within normal limits. IMPRESSION: 1. Bilateral reactive type lymph nodes in the neck. The largest right-sided node corresponds to the palpable area, a posterior level 3 lymph node. The node measures 17 x 10 mm. 2. No focal mass lesion  or primary to suggest malignancy. Electronically Signed   By: Marin Roberts M.D.   On: 05/23/2021 17:43      ____________________________________________   INITIAL IMPRESSION / ASSESSMENT AND PLAN / ED COURSE  As part of my medical decision making, I reviewed the following data within the electronic MEDICAL RECORD NUMBER History obtained from family, Nursing notes reviewed and incorporated, Labs reviewed, Old chart reviewed, Discussed with radiologist, and Notes from prior ED visits        Patient is a 35 yo male who presents to the ER for evaluation of multiple complaints including right sided neck tenderness as well as foul smelling urine. See HPI for further details.  In triage, patient has normal vital signs. PE as above most notable for right sided tender palpable structure, concern for possible mass vs more posterior lymph node.  For evaluation of the neck mass, CBC and CMP were obtained which were grossly unremarkable. CT with contrast was obtained and I personally spoke with radiology regarding the findings. This is felt to likely be a more prominent posterior lymph node, felt by radiology to be  likely reactive. I offered covid, flu and strep testing however the patient does not have any remaining symptoms and declines this testing at this time.   In regards to the malodorous urine, the urinalysis was obtained and does demonstrate a moderate Hgb as well as few bacteria and trace leukocytes. Given persistence of this despite completion of prior course of antibiotics, prior culture was reviewed. This demonstrated a enterobacter aerogenes species with resistance to 3rd generation cephalosporins as well as pip/tazo and nitorfurantoin. It does demonstrate sensitivity to bactrim, however the patient completed course of this without complete resolution. He continues to deny any flank pain, fever, abdominal pain or nausea/vomiting. He doesn't have any findings today of pyelonephritis. Will  treat with short course of Cipro. Patient has appt in 2 days to establish with primary care and encouraged close follow up with their team regarding this. Patient is amenable with plan, and stable at this time for outpatient follow up.       ____________________________________________   FINAL CLINICAL IMPRESSION(S) / ED DIAGNOSES  Final diagnoses:  Cervical lymphadenopathy  Acute cystitis with hematuria     ED Discharge Orders          Ordered    ciprofloxacin (CIPRO) 500 MG tablet  2 times daily        05/23/21 1757             Note:  This document was prepared using Dragon voice recognition software and may include unintentional dictation errors.    Lucy Chris, [place] 05/23/21 1945  Jene Every, MD 05/25/21 1440

## 2021-05-23 NOTE — ED Triage Notes (Signed)
Pt reports that he found a lump on the back of his neck this am. It is on the right side. It is firm and sore. Pt also reports that he was here a month ago for a UTI and he is still having dark urine with a strong odor. He was put on antibiotics.

## 2021-05-27 LAB — CARBAPENEM RESISTANCE PANEL
Carba Resistance IMP Gene: NOT DETECTED
Carba Resistance KPC Gene: NOT DETECTED
Carba Resistance NDM Gene: NOT DETECTED
Carba Resistance OXA48 Gene: NOT DETECTED
Carba Resistance VIM Gene: NOT DETECTED

## 2021-05-27 LAB — URINE CULTURE: Culture: 70000 — AB

## 2021-09-01 ENCOUNTER — Encounter: Payer: Self-pay | Admitting: Emergency Medicine

## 2021-09-01 ENCOUNTER — Other Ambulatory Visit: Payer: Self-pay

## 2021-09-01 ENCOUNTER — Ambulatory Visit
Admission: EM | Admit: 2021-09-01 | Discharge: 2021-09-01 | Disposition: A | Discharge status: Home / Self Care | Payer: 59 | Attending: Emergency Medicine | Admitting: Emergency Medicine

## 2021-09-01 DIAGNOSIS — Z113 Encounter for screening for infections with a predominantly sexual mode of transmission: Secondary | ICD-10-CM | POA: Insufficient documentation

## 2021-09-01 DIAGNOSIS — R300 Dysuria: Secondary | ICD-10-CM | POA: Insufficient documentation

## 2021-09-01 DIAGNOSIS — R109 Unspecified abdominal pain: Secondary | ICD-10-CM | POA: Insufficient documentation

## 2021-09-01 DIAGNOSIS — N390 Urinary tract infection, site not specified: Secondary | ICD-10-CM | POA: Insufficient documentation

## 2021-09-01 DIAGNOSIS — R102 Pelvic and perineal pain: Secondary | ICD-10-CM | POA: Diagnosis present

## 2021-09-01 DIAGNOSIS — Z87440 Personal history of urinary (tract) infections: Secondary | ICD-10-CM | POA: Insufficient documentation

## 2021-09-01 DIAGNOSIS — Z87891 Personal history of nicotine dependence: Secondary | ICD-10-CM | POA: Insufficient documentation

## 2021-09-01 HISTORY — DX: Urinary tract infection, site not specified: N39.0

## 2021-09-01 LAB — POCT URINALYSIS DIP (DEVICE)
Bilirubin Urine: NEGATIVE
Glucose, UA: NEGATIVE mg/dL
Ketones, ur: NEGATIVE mg/dL
Nitrite: NEGATIVE
Protein, ur: NEGATIVE mg/dL
Specific Gravity, Urine: 1.015 (ref 1.005–1.030)
Urobilinogen, UA: 0.2 mg/dL (ref 0.0–1.0)
pH: 7 (ref 5.0–8.0)

## 2021-09-01 MED ORDER — CIPROFLOXACIN HCL 500 MG PO TABS: 500.0000 mg | ORAL_TABLET | Freq: Two times a day (BID) | ORAL | 0 refills | Status: AC

## 2021-09-01 NOTE — ED Provider Notes (Signed)
[facility]    CSN: [id number] Arrival date & time: 09/01/21  1713      History   Chief Complaint Chief Complaint  Patient presents with   Dysuria   Flank Pain    left    HPI Douglas Banks is a 35 y.o. male.   35 year old male presents with left flank pain and pain with urination for the past 2 to 3 days. Pain feels similar to previous UTI. Denies any fever, upper abdominal pain, nausea, vomiting, diarrhea or penile discharge or pain. No distinct gross hematuria. Has history of left renal calculi in March 2022. Came here to Urgent Care and was first dx with muscle strain and was given Mobic and muscle relaxer. The next day he went to [facility] ER and CT revealed left kidney stone and probable pyelonephritis. He was given a shot of Rocephin, Toradol and Zofran and sent home on Vantin. Urine culture revealed Enterobacter species which was resistant to Rocephin and Vantin and he was switched to another antibiotic but uncertain of name (can not find any notes regarding antibiotic switch in chart). He came back to Urgent Care 2 days later and was given Flomax to help with urinary flow. He then went to a Insurance underwriter and had a Vasectomy in April 2022. He went back to [facility] ER in June 2022 with continued urinary symptoms. He was dx with another UTI and placed on Cipro. His urine culture continued to reveal Enterobacter species which continued to be resistant to Rocephin but sensitive to Cipro and Bactrim. Uncertain if he has passed his kidney stone. His symptoms resolved until a few days ago. He also requests STD testing today. No other chronic health issues except urinary as discussed above. Takes no daily medication.   The history is provided by the patient.   Past Medical History:  Diagnosis Date   Urinary tract infection     Patient Active Problem List   Diagnosis Date Noted   Urinary tract infection 09/01/2021    Past Surgical History:  Procedure Laterality Date   NO PAST  SURGERIES     VASECTOMY         Home Medications    Prior to Admission medications   Medication Sig Start Date End Date Taking? Authorizing Provider  ciprofloxacin (CIPRO) 500 MG tablet Take 1 tablet (500 mg total) by mouth 2 (two) times daily for 7 days. 09/01/21 09/08/21 Yes Yer Olivencia, Ali Lowe, NP  tamsulosin (FLOMAX) 0.4 MG CAPS capsule Take 1 capsule (0.4 mg total) by mouth daily. 02/20/21   Rodriguez-Southworth, Nettie Elm, PA-C    Family History Family History  Problem Relation Age of Onset   Diabetes Mother    Other Father        unknown medical history    Social History Social History   Tobacco Use   Smoking status: Former    Years: 10.00    Types: Cigarettes    Quit date: 01/12/2017    Years since quitting: 4.6   Smokeless tobacco: Never   Tobacco comments:    1 pack a week  Vaping Use   Vaping Use: Some days   Last attempt to quit: 01/13/2020   Substances: Nicotine, Flavoring  Substance Use Topics   Alcohol use: Yes    Comment: social   Drug use: Never     Allergies   Patient has no known allergies.   Review of Systems Review of Systems  Constitutional:  Negative for activity change, appetite change,  chills, diaphoresis, fatigue and fever.  HENT:  Negative for mouth sores and sore throat.   Respiratory:  Negative for cough and chest tightness.   Gastrointestinal:  Positive for abdominal pain (lower suprapubic). Negative for constipation, diarrhea, nausea and vomiting.  Genitourinary:  Positive for decreased urine volume, dysuria, flank pain and frequency. Negative for difficulty urinating, genital sores, hematuria, penile discharge, penile pain, penile swelling, scrotal swelling and testicular pain.  Musculoskeletal:  Negative for arthralgias, back pain and myalgias.  Skin:  Negative for color change and rash.  Allergic/Immunologic: Negative for environmental allergies, food allergies and immunocompromised state.  Neurological:  Negative for dizziness,  seizures, syncope, weakness, numbness and headaches.  Hematological:  Negative for adenopathy. Does not bruise/bleed easily.    Physical Exam Triage Vital Signs ED Triage Vitals  Enc Vitals Group     BP 09/01/21 1728 114/88     Pulse Rate 09/01/21 1728 92     Resp 09/01/21 1728 18     Temp 09/01/21 1728 98.3 F (36.8 C)     Temp Source 09/01/21 1728 Oral     SpO2 09/01/21 1728 99 %     Weight --      Height --      Head Circumference --      Peak Flow --      Pain Score 09/01/21 1726 2     Pain Loc --      Pain Edu? --      Excl. in GC? --    No data found.  Updated Vital Signs BP 114/88 (BP Location: Left Arm)   Pulse 92   Temp 98.3 F (36.8 C) (Oral)   Resp 18   SpO2 99%   Visual Acuity Right Eye Distance:   Left Eye Distance:   Bilateral Distance:    Right Eye Near:   Left Eye Near:    Bilateral Near:     Physical Exam Vitals and nursing note reviewed.  Constitutional:      General: He is awake. He is not in acute distress.    Appearance: He is well-developed and well-groomed.     Comments: He is sitting on the exam table in no acute distress.   HENT:     Head: Normocephalic and atraumatic.     Right Ear: Hearing normal.     Left Ear: Hearing normal.  Eyes:     Extraocular Movements: Extraocular movements intact.     Conjunctiva/sclera: Conjunctivae normal.  Cardiovascular:     Rate and Rhythm: Normal rate and regular rhythm.     Heart sounds: Normal heart sounds. No murmur heard. Pulmonary:     Effort: Pulmonary effort is normal. No respiratory distress.     Breath sounds: Normal breath sounds and air entry. No decreased air movement. No decreased breath sounds, wheezing, rhonchi or rales.  Abdominal:     General: Abdomen is flat. Bowel sounds are normal.     Palpations: Abdomen is soft.     Tenderness: There is abdominal tenderness in the suprapubic area. There is left CVA tenderness. There is no right CVA tenderness, guarding or rebound.   Genitourinary:    Comments: Patient collected self-swab for testing. No genital exam performed.  Musculoskeletal:        General: Normal range of motion.     Cervical back: Normal range of motion.  Skin:    General: Skin is warm and dry.     Findings: No rash.  Neurological:  General: No focal deficit present.     Mental Status: He is alert and oriented to person, place, and time.  Psychiatric:        Mood and Affect: Mood normal.        Behavior: Behavior normal. Behavior is cooperative.        Thought Content: Thought content normal.        Judgment: Judgment normal.     UC Treatments / Results  Labs (all labs ordered are listed, but only abnormal results are displayed) Labs Reviewed  POCT URINALYSIS DIP (DEVICE) - Abnormal; Notable for the following components:      Result Value   Hgb urine dipstick MODERATE (*)    Leukocytes,Ua SMALL (*)    All other components within normal limits  URINE CULTURE  POCT URINALYSIS DIPSTICK, ED / UC  CERVICOVAGINAL ANCILLARY ONLY    EKG   Radiology No results found.  Procedures Procedures (including critical care time)  Medications Ordered in UC Medications - No data to display  Initial Impression / Assessment and Plan / UC Course  I have reviewed the triage vital signs and the nursing notes.  Pertinent labs & imaging results that were available during my care of the patient were reviewed by me and considered in my medical decision making (see chart for details).     Reviewed urinalysis results with patient. Positive blood and WBC's. No protein. Discussed that he may have another UTI. Urine sent for culture. Discussed that kidney stone may be contributing to recurrent UTI but no current findings suggesting blockage. Will start Cipro 500mg  twice a day as directed but discussed that we may switch antibiotic pending urine culture results. Continue to push fluids and monitor symptoms. Urethral specimen sent for testing- blood  work to be sent tomorrow AM. Encouraged to contact Urologist to schedule follow-up appointment for further evaluation. If pain and symptoms worsen, go to the ER ASAP. Otherwise, follow-up pending urine culture and lab results and with his Urologist as planned.  Final Clinical Impressions(s) / UC Diagnoses   Final diagnoses:  Acute left flank pain  Dysuria  Suprapubic abdominal pain     Discharge Instructions      Recommend start Cipro 500mg  twice a day as directed. Continue to push fluids and monitor symptoms. If pain and symptoms worsen, go to the ER ASAP. Otherwise, follow-up pending urine culture and lab results and with your Urologist as planned.      ED Prescriptions     Medication Sig Dispense Auth. Provider   ciprofloxacin (CIPRO) 500 MG tablet Take 1 tablet (500 mg total) by mouth 2 (two) times daily for 7 days. 14 tablet Edithe Dobbin, , NP      PDMP not reviewed this encounter.   , NP 09/02/21 1100

## 2021-09-01 NOTE — Discharge Instructions (Addendum)
Recommend start Cipro 500mg  twice a day as directed. Continue to push fluids and monitor symptoms. If pain and symptoms worsen, go to the ER ASAP. Otherwise, follow-up pending urine culture and lab results and with your Urologist as planned.

## 2021-09-01 NOTE — ED Triage Notes (Signed)
Pt c/o of dysuria and left flank pain x 2-3 days. Denies n/v/d or fever.

## 2021-09-02 ENCOUNTER — Encounter: Payer: Self-pay | Admitting: Family

## 2021-09-02 LAB — CERVICOVAGINAL ANCILLARY ONLY
Chlamydia: NEGATIVE
Comment: NEGATIVE
Comment: NEGATIVE
Comment: NORMAL
Neisseria Gonorrhea: NEGATIVE
Trichomonas: NEGATIVE

## 2021-09-03 LAB — URINE CULTURE: Culture: >=100000 — AB

## 2022-02-21 ENCOUNTER — Ambulatory Visit
Admission: EM | Admit: 2022-02-21 | Discharge: 2022-02-21 | Disposition: A | Discharge status: Home / Self Care | Payer: 59 | Attending: Physician Assistant | Admitting: Physician Assistant

## 2022-02-21 ENCOUNTER — Other Ambulatory Visit: Payer: Self-pay

## 2022-02-21 DIAGNOSIS — J029 Acute pharyngitis, unspecified: Secondary | ICD-10-CM

## 2022-02-21 DIAGNOSIS — J020 Streptococcal pharyngitis: Secondary | ICD-10-CM | POA: Diagnosis present

## 2022-02-21 LAB — GROUP A STREP BY PCR: Group A Strep by PCR: DETECTED — AB

## 2022-02-21 MED ORDER — AMOXICILLIN 500 MG PO CAPS: 500.0000 mg | ORAL_CAPSULE | Freq: Two times a day (BID) | ORAL | 0 refills | Status: AC

## 2022-02-21 NOTE — ED Triage Notes (Signed)
Patient states he had covid 2 weeks ago. States sore throat has been ongoing since then. States yesterday sore throat increased. Patient states that he woke up this morning with right sided lymph node swelling. States throat is sore as well.  ?

## 2022-02-21 NOTE — ED Provider Notes (Signed)
?[facility] ? ? ? ?CSN: [id number] ?Arrival date & time: 02/21/22  1253 ? ? ?  ? ?History   ?Chief Complaint ?Chief Complaint  ?Patient presents with  ? Sore Throat  ? ? ?HPI ?Douglas Banks is a 36 y.o. male presenting for a sore throat that originally started a couple weeks ago when he was diagnosed with COVID-19.  States the sore throat improved until yesterday when it worsened and he noticed increased fatigue and lymph node swelling.  Denies continued cough or congestion.  No difficulty breathing, vomiting or diarrhea.  Has not had any fevers.  Not taking any OTC meds.  Daughter is sick with similar symptoms and has a fever.  Patient is otherwise healthy without any chronic medical problems. ? ?HPI ? ?Past Medical History:  ?Diagnosis Date  ? Urinary tract infection   ? ? ?Patient Active Problem List  ? Diagnosis Date Noted  ? Urinary tract infection 09/01/2021  ? ? ?Past Surgical History:  ?Procedure Laterality Date  ? NO PAST SURGERIES    ? VASECTOMY    ? ? ? ? ? ?Home Medications   ? ?Prior to Admission medications   ?Medication Sig Start Date End Date Taking? Authorizing Provider  ?amoxicillin (AMOXIL) 500 MG capsule Take 1 capsule (500 mg total) by mouth 2 (two) times daily for 10 days. 02/21/22 03/03/22 Yes Eusebio Friendly B, PA-C  ?tamsulosin (FLOMAX) 0.4 MG CAPS capsule Take 1 capsule (0.4 mg total) by mouth daily. 02/20/21   Rodriguez-Southworth, Nettie Elm, PA-C  ? ? ?Family History ?Family History  ?Problem Relation Age of Onset  ? Diabetes Mother   ? Other Father   ?     unknown medical history  ? ? ?Social History ?Social History  ? ?Tobacco Use  ? Smoking status: Former  ?  Years: 10.00  ?  Types: Cigarettes  ?  Quit date: 01/12/2017  ?  Years since quitting: 5.1  ? Smokeless tobacco: Never  ? Tobacco comments:  ?  1 pack a week  ?Vaping Use  ? Vaping Use: Some days  ? Last attempt to quit: 01/13/2020  ? Substances: Nicotine, Flavoring  ?Substance Use Topics  ? Alcohol use: Not Currently  ?   Comment: social  ? Drug use: Never  ? ? ? ?Allergies   ?Patient has no known allergies. ? ? ?Review of Systems ?Review of Systems  ?Constitutional:  Positive for fatigue. Negative for fever.  ?HENT:  Positive for sore throat. Negative for congestion, rhinorrhea, sinus pressure and sinus pain.   ?Respiratory:  Negative for cough and shortness of breath.   ?Gastrointestinal:  Negative for abdominal pain, diarrhea, nausea and vomiting.  ?Musculoskeletal:  Negative for myalgias.  ?Neurological:  Negative for weakness, light-headedness and headaches.  ?Hematological:  Positive for adenopathy.  ? ? ?Physical Exam ?Triage Vital Signs ?ED Triage Vitals  ?Enc Vitals Group  ?   BP --   ?   Pulse --   ?   Resp --   ?   Temp --   ?   Temp src --   ?   SpO2 --   ?   Weight 02/21/22 1307 220 lb (99.8 kg)  ?   Height 02/21/22 1307 6\' 3"  (1.905 m)  ?   Head Circumference --   ?   Peak Flow --   ?   Pain Score 02/21/22 1306 3  ?   Pain Loc --   ?   Pain Edu? --   ?  Excl. in GC? --   ? ?No data found. ? ?Updated Vital Signs ?BP 134/84 (BP Location: Left Arm)   Pulse 77   Temp 98.7 ?F (37.1 ?C) (Oral)   Resp 18   Ht 6\' 3"  (1.905 m)   Wt 220 lb (99.8 kg)   SpO2 97%   BMI 27.50 kg/m?  ? ?  ? ?Physical Exam ?Vitals and nursing note reviewed.  ?Constitutional:   ?   General: He is not in acute distress. ?   Appearance: Normal appearance. He is well-developed. He is not ill-appearing.  ?HENT:  ?   Head: Normocephalic and atraumatic.  ?   Nose: Nose normal.  ?   Mouth/Throat:  ?   Mouth: Mucous membranes are moist.  ?   Pharynx: Oropharynx is clear. Posterior oropharyngeal erythema present.  ?   Tonsils: 1+ on the right. 1+ on the left.  ?Eyes:  ?   General: No scleral icterus. ?   Conjunctiva/sclera: Conjunctivae normal.  ?Cardiovascular:  ?   Rate and Rhythm: Normal rate and regular rhythm.  ?   Heart sounds: Normal heart sounds. No murmur heard. ?Pulmonary:  ?   Effort: Pulmonary effort is normal. No respiratory distress.  ?    Breath sounds: Normal breath sounds.  ?Musculoskeletal:  ?   Cervical back: Neck supple.  ?Lymphadenopathy:  ?   Cervical: Cervical adenopathy present.  ?Skin: ?   General: Skin is warm and dry.  ?   Capillary Refill: Capillary refill takes less than 2 seconds.  ?Neurological:  ?   General: No focal deficit present.  ?   Mental Status: He is alert. Mental status is at baseline.  ?   Motor: No weakness.  ?   Coordination: Coordination normal.  ?   Gait: Gait normal.  ?Psychiatric:     ?   Mood and Affect: Mood normal.     ?   Behavior: Behavior normal.     ?   Thought Content: Thought content normal.  ? ? ? ?UC Treatments / Results  ?Labs ?(all labs ordered are listed, but only abnormal results are displayed) ?Labs Reviewed  ?GROUP A STREP BY PCR - Abnormal; Notable for the following components:  ?    Result Value  ? Group A Strep by PCR DETECTED (*)   ? All other components within normal limits  ? ? ?EKG ? ? ?Radiology ?No results found. ? ?Procedures ?Procedures (including critical care time) ? ?Medications Ordered in UC ?Medications - No data to display ? ?Initial Impression / Assessment and Plan / UC Course  ?I have reviewed the triage vital signs and the nursing notes. ? ?Pertinent labs & imaging results that were available during my care of the patient were reviewed by me and considered in my medical decision making (see chart for details). ? ?36 year old male presenting for sore throat.  Patient diagnosed with COVID-19 2 weeks ago.  Denies associated fever.  Reports lymph node swelling.  Vitals normal and stable.  Patient is overall well-appearing.  On exam he does have erythema posterior pharynx with 1+ bilateral tonsil enlargement and anterior cervical lymph node swelling.  Chest clear to auscultation.  PCR strep test obtained.  Positive.  Discussed results with patient.  Treating patient with amoxicillin.  Also reviewed supportive care.  Reviewed return to ER precautions and gave him a work note. ? ? ?Final  Clinical Impressions(s) / UC Diagnoses  ? ?Final diagnoses:  ?Strep pharyngitis  ?Sore throat  ? ?  Discharge Instructions   ?None ?  ? ?ED Prescriptions   ? ? Medication Sig Dispense Auth. Provider  ? amoxicillin (AMOXIL) 500 MG capsule Take 1 capsule (500 mg total) by mouth 2 (two) times daily for 10 days. 20 capsule Shirlee Latch, PA-C  ? ?  ? ?PDMP not reviewed this encounter. ?  ?Shirlee Latch, PA-C ?02/21/22 1417 ? ?

## 2022-05-12 ENCOUNTER — Ambulatory Visit
Admission: EM | Admit: 2022-05-12 | Discharge: 2022-05-12 | Disposition: A | Discharge status: Home / Self Care | Payer: 59 | Attending: Emergency Medicine | Admitting: Emergency Medicine

## 2022-05-12 DIAGNOSIS — N390 Urinary tract infection, site not specified: Secondary | ICD-10-CM | POA: Insufficient documentation

## 2022-05-12 LAB — URINALYSIS, MICROSCOPIC (REFLEX)

## 2022-05-12 LAB — URINALYSIS, ROUTINE W REFLEX MICROSCOPIC
Bilirubin Urine: NEGATIVE
Glucose, UA: NEGATIVE mg/dL
Ketones, ur: NEGATIVE mg/dL
Nitrite: NEGATIVE
Protein, ur: NEGATIVE mg/dL
Specific Gravity, Urine: 1.015 (ref 1.005–1.030)
pH: 5.5 (ref 5.0–8.0)

## 2022-05-12 MED ORDER — NITROFURANTOIN MONOHYD MACRO 100 MG PO CAPS: 100.0000 mg | ORAL_CAPSULE | Freq: Two times a day (BID) | ORAL | 0 refills | Status: DC

## 2022-05-12 MED ORDER — PHENAZOPYRIDINE HCL 200 MG PO TABS: 200.0000 mg | ORAL_TABLET | Freq: Three times a day (TID) | ORAL | 0 refills | Status: DC

## 2022-05-12 NOTE — ED Triage Notes (Signed)
Patient presents to [facility] for UTI symptoms.  Patient c/o frequent urination -- started about an hour ago.

## 2022-05-12 NOTE — ED Provider Notes (Signed)
[facility]    CSN: [id number] Arrival date & time: 05/12/22  1908      History   Chief Complaint Chief Complaint  Patient presents with   Urinary Frequency    HPI Douglas Banks is a 36 y.o. male.   HPI  34 old male here for evaluation of urinary complaints.  Patient reports that he has been experiencing urgency and frequency of urination for the past hour.  He also endorses some mild low back pain on the left-hand side but he denies any pain with urination, blood in his urine, fever, or abdominal pain.  Past Medical History:  Diagnosis Date   Urinary tract infection     Patient Active Problem List   Diagnosis Date Noted   Urinary tract infection 09/01/2021    Past Surgical History:  Procedure Laterality Date   NO PAST SURGERIES     VASECTOMY         Home Medications    Prior to Admission medications   Medication Sig Start Date End Date Taking? Authorizing Provider  nitrofurantoin, macrocrystal-monohydrate, (MACROBID) 100 MG capsule Take 1 capsule (100 mg total) by mouth 2 (two) times daily. 05/12/22  Yes Becky Augusta, NP  phenazopyridine (PYRIDIUM) 200 MG tablet Take 1 tablet (200 mg total) by mouth 3 (three) times daily. 05/12/22  Yes Becky Augusta, NP  tamsulosin (FLOMAX) 0.4 MG CAPS capsule Take 1 capsule (0.4 mg total) by mouth daily. 02/20/21  Yes Rodriguez-Southworth, Nettie Elm, PA-C    Family History Family History  Problem Relation Age of Onset   Diabetes Mother    Other Father        unknown medical history    Social History Social History   Tobacco Use   Smoking status: Former    Years: 10.00    Types: Cigarettes    Quit date: 01/12/2017    Years since quitting: 5.3   Smokeless tobacco: Never   Tobacco comments:    1 pack a week  Vaping Use   Vaping Use: Some days   Last attempt to quit: 01/13/2020   Substances: Nicotine, Flavoring  Substance Use Topics   Alcohol use: Not Currently    Comment: social   Drug use: Never      Allergies   Patient has no known allergies.   Review of Systems Review of Systems  Constitutional:  Negative for fever.  Gastrointestinal:  Negative for abdominal pain.  Genitourinary:  Positive for frequency and urgency. Negative for dysuria and hematuria.  Musculoskeletal:  Positive for back pain.  Hematological: Negative.   Psychiatric/Behavioral: Negative.      Physical Exam Triage Vital Signs ED Triage Vitals  Enc Vitals Group     BP 05/12/22 1913 (!) 131/96     Pulse Rate 05/12/22 1913 97     Resp 05/12/22 1913 20     Temp 05/12/22 1913 97.9 F (36.6 C)     Temp src --      SpO2 05/12/22 1913 100 %     Weight 05/12/22 1911 225 lb (102.1 kg)     Height 05/12/22 1911 6\' 3"  (1.905 m)     Head Circumference --      Peak Flow --      Pain Score 05/12/22 1911 0     Pain Loc --      Pain Edu? --      Excl. in GC? --    No data found.  Updated Vital Signs BP (!) 131/96 (BP  Location: Left Arm)   Pulse 97   Temp 97.9 F (36.6 C)   Resp 20   Ht 6\' 3"  (1.905 m)   Wt 225 lb (102.1 kg)   SpO2 100%   BMI 28.12 kg/m   Visual Acuity Right Eye Distance:   Left Eye Distance:   Bilateral Distance:    Right Eye Near:   Left Eye Near:    Bilateral Near:     Physical Exam Vitals and nursing note reviewed.  Constitutional:      Appearance: Normal appearance. He is not ill-appearing.  HENT:     Head: Normocephalic and atraumatic.  Cardiovascular:     Rate and Rhythm: Normal rate and regular rhythm.     Pulses: Normal pulses.     Heart sounds: Normal heart sounds. No murmur heard.   No friction rub. No gallop.  Pulmonary:     Effort: Pulmonary effort is normal.     Breath sounds: Normal breath sounds. No wheezing, rhonchi or rales.  Abdominal:     Tenderness: There is no right CVA tenderness or left CVA tenderness.  Skin:    General: Skin is warm and dry.     Capillary Refill: Capillary refill takes less than 2 seconds.     Findings: No erythema or  rash.  Neurological:     General: No focal deficit present.     Mental Status: He is alert and oriented to person, place, and time.  Psychiatric:        Mood and Affect: Mood normal.        Behavior: Behavior normal.        Thought Content: Thought content normal.        Judgment: Judgment normal.     UC Treatments / Results  Labs (all labs ordered are listed, but only abnormal results are displayed) Labs Reviewed  URINALYSIS, ROUTINE W REFLEX MICROSCOPIC - Abnormal; Notable for the following components:      Result Value   Hgb urine dipstick MODERATE (*)    Leukocytes,Ua TRACE (*)    All other components within normal limits  URINALYSIS, MICROSCOPIC (REFLEX) - Abnormal; Notable for the following components:   Bacteria, UA FEW (*)    All other components within normal limits  URINE CULTURE    EKG   Radiology No results found.  Procedures Procedures (including critical care time)  Medications Ordered in UC Medications - No data to display  Initial Impression / Assessment and Plan / UC Course  I have reviewed the triage vital signs and the nursing notes.  Pertinent labs & imaging results that were available during my care of the patient were reviewed by me and considered in my medical decision making (see chart for details).  Patient very pleasant, nontoxic-appearing 36 year old male here for evaluation of urgency and frequency of urination along with low back pain that started approximately hour prior to arrival.  No fever, pain with urination, blood in urine, or abdominal pain.  On exam patient is a benign cardiopulmonary exam with S1-S2 heart sounds with regular rate and rhythm and lung sounds that are clear to auscultation all fields.  No CVA tenderness on exam.  Will check urinalysis.  Urinalysis shows moderate hemoglobin and trace leukocyte esterase.  Negative for nitrites or protein.  Reflex micro shows 6-10 WBCs and few bacteria.  I will send urine for  culture.  I will discharge patient home on Macrobid and Pyridium to help with his urine discomfort and to treat  the infection.     Final Clinical Impressions(s) / UC Diagnoses   Final diagnoses:  Lower urinary tract infectious disease     Discharge Instructions      Take the Macrobid twice daily for 5 days with food for treatment of urinary tract infection.  Use the Pyridium every 8 hours as needed for urinary discomfort.  This will turn your urine a bright red-orange.  Increase your oral fluid intake so that you increase your urine production and or flushing your urinary system.  Take an over-the-counter probiotic, such as Culturelle-Align-Activia, 1 hour after each dose of antibiotic to prevent diarrhea or yeast infections from forming.  We will culture urine and change the antibiotics if necessary.  Return for reevaluation, or see your primary care provider, for any new or worsening symptoms.      ED Prescriptions     Medication Sig Dispense Auth. Provider   nitrofurantoin, macrocrystal-monohydrate, (MACROBID) 100 MG capsule Take 1 capsule (100 mg total) by mouth 2 (two) times daily. 10 capsule Becky Augusta, NP   phenazopyridine (PYRIDIUM) 200 MG tablet Take 1 tablet (200 mg total) by mouth 3 (three) times daily. 6 tablet Becky Augusta, NP      PDMP not reviewed this encounter.   Becky Augusta, NP 05/12/22 1944

## 2022-05-12 NOTE — Discharge Instructions (Addendum)

## 2022-05-15 LAB — URINE CULTURE: Culture: >=100000 — AB

## 2022-05-17 ENCOUNTER — Telehealth (HOSPITAL_COMMUNITY): Payer: Self-pay | Admitting: Emergency Medicine

## 2022-05-17 MED ORDER — SULFAMETHOXAZOLE-TRIMETHOPRIM 800-160 MG PO TABS: 1.0000 | ORAL_TABLET | Freq: Two times a day (BID) | ORAL | 0 refills | Status: AC

## 2022-06-24 ENCOUNTER — Encounter
Admission: RE | Disposition: A | Discharge status: Home / Self Care | Payer: Self-pay | Source: Home / Self Care | Attending: Urology

## 2022-06-24 ENCOUNTER — Ambulatory Visit
Admission: RE | Admit: 2022-06-24 | Discharge: 2022-06-24 | Disposition: A | Discharge status: Home / Self Care | Payer: 59 | Attending: Urology | Admitting: Urology

## 2022-06-24 DIAGNOSIS — I10 Essential (primary) hypertension: Secondary | ICD-10-CM | POA: Diagnosis not present

## 2022-06-24 DIAGNOSIS — N200 Calculus of kidney: Secondary | ICD-10-CM | POA: Diagnosis present

## 2022-06-24 DIAGNOSIS — E669 Obesity, unspecified: Secondary | ICD-10-CM | POA: Insufficient documentation

## 2022-06-24 DIAGNOSIS — Z6828 Body mass index (BMI) 28.0-28.9, adult: Secondary | ICD-10-CM | POA: Insufficient documentation

## 2022-06-24 HISTORY — PX: EXTRACORPOREAL SHOCK WAVE LITHOTRIPSY: SHX1557

## 2022-06-24 SURGERY — LITHOTRIPSY, ESWL
Anesthesia: Moderate Sedation | Laterality: Left

## 2022-06-24 MED ORDER — FUROSEMIDE 10 MG/ML IJ SOLN
10.0000 mg | Freq: Once | INTRAMUSCULAR | Status: AC
  Administered 2022-06-24: 10 mg via INTRAVENOUS

## 2022-06-24 MED ORDER — ACETAMINOPHEN-CODEINE 300-30 MG PO TABS: 1.0000 | ORAL_TABLET | ORAL | 0 refills | Status: DC | PRN

## 2022-06-24 MED ORDER — LEVOFLOXACIN 500 MG PO TABS
500.0000 mg | ORAL_TABLET | ORAL | Status: AC
  Administered 2022-06-24: 500 mg via ORAL

## 2022-06-24 MED ORDER — CIPROFLOXACIN HCL 500 MG PO TABS: 500.0000 mg | ORAL_TABLET | Freq: Two times a day (BID) | ORAL | 0 refills | Status: DC

## 2022-06-24 MED ORDER — DIPHENHYDRAMINE HCL 25 MG PO CAPS
25.0000 mg | ORAL_CAPSULE | ORAL | Status: AC
Start: 2022-06-24 — End: 2022-06-24
  Administered 2022-06-24: 25 mg via ORAL

## 2022-06-24 MED ORDER — DEXTROSE-NACL 5-0.45 % IV SOLN
INTRAVENOUS | Status: DC
Start: 2022-06-24 — End: 2022-06-24

## 2022-06-24 MED ORDER — MORPHINE SULFATE (PF) 10 MG/ML IV SOLN
INTRAVENOUS | Status: AC
  Administered 2022-06-24: 10 mg via INTRAMUSCULAR
  Filled 2022-06-24: qty 1

## 2022-06-24 MED ORDER — MIDAZOLAM HCL 2 MG/2ML IJ SOLN
INTRAMUSCULAR | Status: AC
  Administered 2022-06-24: 1 mg via INTRAMUSCULAR
  Filled 2022-06-24: qty 2

## 2022-06-24 MED ORDER — LEVOFLOXACIN 500 MG PO TABS
ORAL_TABLET | ORAL | Status: AC
  Filled 2022-06-24: qty 1

## 2022-06-24 MED ORDER — PROMETHAZINE HCL 25 MG/ML IJ SOLN
25.0000 mg | Freq: Once | INTRAMUSCULAR | Status: AC
Start: 2022-06-24 — End: 2022-06-24

## 2022-06-24 MED ORDER — FUROSEMIDE 10 MG/ML IJ SOLN
INTRAMUSCULAR | Status: AC
  Filled 2022-06-24: qty 2

## 2022-06-24 MED ORDER — MORPHINE SULFATE (PF) 10 MG/ML IV SOLN: 10.0000 mg | Freq: Once | INTRAVENOUS | Status: AC

## 2022-06-24 MED ORDER — DIPHENHYDRAMINE HCL 25 MG PO CAPS
ORAL_CAPSULE | ORAL | Status: AC
  Filled 2022-06-24: qty 1

## 2022-06-24 MED ORDER — MIDAZOLAM HCL 2 MG/2ML IJ SOLN: 1.0000 mg | Freq: Once | INTRAMUSCULAR | Status: AC

## 2022-06-24 MED ORDER — DIAZEPAM 5 MG PO TABS: 10.0000 mg | ORAL_TABLET | ORAL | Status: DC

## 2022-06-24 MED ORDER — PROMETHAZINE HCL 25 MG/ML IJ SOLN
INTRAMUSCULAR | Status: AC
  Administered 2022-06-24: 25 mg via INTRAMUSCULAR
  Filled 2022-06-24: qty 1

## 2022-06-24 NOTE — Discharge Instructions (Addendum)
Lithotripsy, Care After This sheet gives you information about how to care for yourself after your procedure. Your health care provider may also give you more specific instructions. If you have problems or questions, contact your health care provider. What can I expect after the procedure? After the procedure, it is common to have: Some blood in your urine. This should only last for a few days. Soreness in your back, sides, or upper abdomen for a few days. Blotches or bruises on the area where the shock wave entered the skin. Pain, discomfort, or nausea when pieces (fragments) of the kidney stone move through the tube that carries urine from the kidney to the bladder (ureter). Stone fragments may pass soon after the procedure, but they may continue to pass for up to 4-8 weeks. If you have severe pain or nausea, contact your health care provider. This may be caused by a large stone that was not broken up, and this may mean that you need more treatment. Some pain or discomfort during urination. Some pain or discomfort in the lower abdomen or (in men) at the base of the penis. Follow these instructions at home: Medicines Take over-the-counter and prescription medicines only as told by your health care provider. If you were prescribed an antibiotic medicine, take it as told by your health care provider. Do not stop taking the antibiotic even if you start to feel better. Ask your health care provider if the medicine prescribed to you requires you to avoid driving or using machinery. Eating and drinking A comparison of three sample cups showing dark yellow, yellow, and pale yellow urine.     A plate with examples of foods in a healthy diet.   Drink enough fluid to keep your urine pale yellow. This helps any remaining pieces of the stone to pass. It can also help prevent new stones from forming. Eat plenty of fresh fruits and vegetables. Follow instructions from your health care provider about  eating or drinking restrictions. You may be instructed to: Reduce how much salt (sodium) you eat or drink. Check ingredients and nutrition facts on packaged foods and beverages to see how much sodium they contain. Reduce how much meat you eat. Eat the recommended amount of calcium for your age and gender. Ask your health care provider how much calcium you should have. General instructions Get plenty of rest. Return to your normal activities as told by your health care provider. Ask your health care provider what activities are safe for you. Most people can resume normal activities 1-2 days after the procedure. If you were given a sedative during the procedure, it can affect you for several hours. Do not drive or operate machinery until your health care provider says that it is safe. Your health care provider may direct you to lie in a certain position (postural drainage) and tap firmly (percuss) over your kidney area to help stone fragments pass. Follow instructions as told by your health care provider. If directed, strain all urine through the strainer that was provided by your health care provider. Keep all fragments for your health care provider to see. Any stones that are found may be sent to a medical lab for examination. The stone may be as small as a grain of salt. Keep all follow-up visits as told by your health care provider. This is important. Contact a health care provider if: You have a fever or chills. You have nausea that is severe or does not go away. You have any   of these urinary symptoms: Blood in your urine for longer than your health care provider told you to expect. Urine that smells bad or unusual. Feeling a strong urge to urinate after emptying your bladder. Pain or burning with urination that does not go away. Urinating more often than usual and this does not go away. You have a stent and it comes out. Get help right away if: You have severe pain in your back, sides, or  upper abdomen. You have any of these urinary symptoms: Severe pain while urinating. More blood in your urine or having blood in your urine when you did not before. Passing blood clots in your urine. Passing only a small amount of urine or being unable to pass any urine at all. You have severe nausea that leads to persistent vomiting. You faint. Summary After this procedure, it is common to have some pain, discomfort, or nausea when pieces (fragments) of the kidney stone move through the tube that carries urine from the kidney to the bladder (ureter). If this pain or nausea is severe, however, you should contact your health care provider. Return to your normal activities as told by your health care provider. Ask your health care provider what activities are safe for you. Drink enough fluid to keep your urine pale yellow. This helps any remaining pieces of the stone to pass, and it can help prevent new stones from forming. If directed, strain your urine and keep all fragments for your health care provider to see. Fragments or stones may be as small as a grain of salt. Get help right away if you have severe pain in your back, sides, or upper abdomen, or if you have severe pain while urinating. This information is not intended to replace advice given to you by your health care provider. Make sure you discuss any questions you have with your health care provider. Document Revised: 10/19/2021 Document Reviewed: 07/27/2021 Elsevier Patient Education  2023 [employer]       AMBULATORY SURGERY  DISCHARGE INSTRUCTIONS   The drugs that you were given will stay in your system until tomorrow so for the next 24 hours you should not:  Drive an automobile Make any legal decisions Drink any alcoholic beverage   You may resume regular meals tomorrow.  Today it is better to start with liquids and gradually work up to solid foods.  You may eat anything you prefer, but it is better to start with  liquids, then soup and crackers, and gradually work up to solid foods.   Please notify your doctor immediately if you have any unusual bleeding, trouble breathing, redness and pain at the surgery site, drainage, fever, or pain not relieved by medication.    Additional Instructions:   FOLLOW [facility] DISCHARGE INSTRUCTION SHEET AS REVIEWED.        Please contact your physician with any problems or Same Day Surgery at [phone], Monday through Friday 6 am to 4 pm, or [place] [facility] number at [phone].  

## 2022-06-25 ENCOUNTER — Encounter: Payer: Self-pay | Admitting: Urology

## 2023-06-01 ENCOUNTER — Ambulatory Visit
Admission: EM | Admit: 2023-06-01 | Discharge: 2023-06-01 | Disposition: A | Discharge status: Home / Self Care | Payer: 59 | Attending: Physician Assistant | Admitting: Physician Assistant

## 2023-06-01 DIAGNOSIS — J029 Acute pharyngitis, unspecified: Secondary | ICD-10-CM | POA: Diagnosis present

## 2023-06-01 LAB — GROUP A STREP BY PCR: Group A Strep by PCR: NOT DETECTED

## 2023-06-01 NOTE — ED Triage Notes (Signed)
Pt c/o white patches back of throat pt states he ntoiced them about 30 minutes ago, does not hurt to swallow, RT side lymph nodes swollen.

## 2023-06-01 NOTE — Discharge Instructions (Signed)
-  Negative strep. Likely viral.  -Supportive care with OTC ibuprofen/Tylenol, rest and fluids. May develop cough and congestion. Treat with Mucinex. -Return for fever or increased swelling.

## 2023-06-01 NOTE — ED Provider Notes (Signed)
[facility]    CSN: [id number] Arrival date & time: 06/01/23  1758      History   Chief Complaint No chief complaint on file.   HPI Douglas Banks is a 37 y.o. male presenting for right anterior cervical lymph node swelling, throat swelling, and exudates of throat that began today. Denies pain or difficulty swallowing. Denies fever, fatigue,  body aches, cough, congestion. No difficulty breathing, vomiting or diarrhea. Not taking any OTC meds.  No sick contacts. Patient is otherwise healthy without any chronic medical problems.  HPI  Past Medical History:  Diagnosis Date   Urinary tract infection     Patient Active Problem List   Diagnosis Date Noted   Urinary tract infection 09/01/2021    Past Surgical History:  Procedure Laterality Date   EXTRACORPOREAL SHOCK WAVE LITHOTRIPSY Left 06/24/2022   Procedure: EXTRACORPOREAL SHOCK WAVE LITHOTRIPSY (ESWL);  Surgeon: Orson Ape, MD;  Location: [facility] OR[facility];  Service: Urology;  Laterality: Left;   NO PAST SURGERIES     VASECTOMY         Home Medications    Prior to Admission medications   Not on File    Family History Family History  Problem Relation Age of Onset   Diabetes Mother    Other Father        unknown medical history    Social History Social History   Tobacco Use   Smoking status: Former    Years: 10    Types: Cigarettes    Quit date: 01/12/2017    Years since quitting: 6.3   Smokeless tobacco: Never   Tobacco comments:    1 pack a week  Vaping Use   Vaping Use: Some days   Last attempt to quit: 01/13/2020   Substances: Nicotine, Flavoring  Substance Use Topics   Alcohol use: Not Currently    Comment: social   Drug use: Never     Allergies   Patient has no known allergies.   Review of Systems Review of Systems  Constitutional:  Negative for fatigue and fever.  HENT:  Negative for congestion, rhinorrhea, sinus pressure, sinus pain, sore throat, trouble swallowing and  voice change.   Respiratory:  Negative for cough and shortness of breath.   Gastrointestinal:  Negative for abdominal pain, diarrhea, nausea and vomiting.  Musculoskeletal:  Negative for myalgias.  Neurological:  Negative for weakness, light-headedness and headaches.  Hematological:  Positive for adenopathy.     Physical Exam Triage Vital Signs ED Triage Vitals  Enc Vitals Group     BP --      Pulse --      Resp --      Temp --      Temp src --      SpO2 --      Weight 02/21/22 1307 220 lb (99.8 kg)     Height 02/21/22 1307 6\' 3"  (1.905 m)     Head Circumference --      Peak Flow --      Pain Score 02/21/22 1306 3     Pain Loc --      Pain Edu? --      Excl. in GC? --    No data found.  Updated Vital Signs BP (!) 130/90 (BP Location: Right Arm)   Pulse 88   Temp 98.3 F (36.8 C) (Oral)   SpO2 96%      Physical Exam Vitals and nursing note reviewed.  Constitutional:  General: He is not in acute distress.    Appearance: Normal appearance. He is well-developed. He is not ill-appearing.  HENT:     Head: Normocephalic and atraumatic.     Nose: Nose normal.     Mouth/Throat:     Mouth: Mucous membranes are moist.     Pharynx: Oropharynx is clear. Posterior oropharyngeal erythema present.     Tonsils: 1+ on the right. 1+ on the left.  Eyes:     General: No scleral icterus.    Conjunctiva/sclera: Conjunctivae normal.  Cardiovascular:     Rate and Rhythm: Normal rate and regular rhythm.     Heart sounds: Normal heart sounds. No murmur heard. Pulmonary:     Effort: Pulmonary effort is normal. No respiratory distress.     Breath sounds: Normal breath sounds.  Musculoskeletal:     Cervical back: Neck supple.  Lymphadenopathy:     Cervical: Cervical adenopathy present.  Skin:    General: Skin is warm and dry.     Capillary Refill: Capillary refill takes less than 2 seconds.  Neurological:     General: No focal deficit present.     Mental Status: He is  alert. Mental status is at baseline.     Motor: No weakness.     Coordination: Coordination normal.     Gait: Gait normal.  Psychiatric:        Mood and Affect: Mood normal.        Behavior: Behavior normal.        Thought Content: Thought content normal.      UC Treatments / Results  Labs (all labs ordered are listed, but only abnormal results are displayed) Labs Reviewed  GROUP A STREP BY PCR    EKG   Radiology No results found.  Procedures Procedures (including critical care time)  Medications Ordered in UC Medications - No data to display  Initial Impression / Assessment and Plan / UC Course  I have reviewed the triage vital signs and the nursing notes.  Pertinent labs & imaging results that were available during my care of the patient were reviewed by me and considered in my medical decision making (see chart for details).  37 year old male presenting for lymph node swelling, throat swelling and exudates. No fever or sore throat but there is concern for strep.  Vitals normal and stable.  Patient is overall well-appearing.  On exam he does have erythema posterior pharynx with 1+ bilateral tonsil enlargement and anterior cervical lymph node swelling.  Chest clear to auscultation.  PCR strep test obtained.  Negative.  Discussed results with patient.  Viral pharyngitis.  Reviewed supportive care.  Reviewed return precautions.   Final Clinical Impressions(s) / UC Diagnoses   Final diagnoses:  Viral pharyngitis     Discharge Instructions      -Negative strep. Likely viral.  -Supportive care with OTC ibuprofen/Tylenol, rest and fluids. May develop cough and congestion. Treat with Mucinex. -Return for fever or increased swelling.     ED Prescriptions   None    PDMP not reviewed this encounter.     Shirlee Latch, PA-C 06/01/23 1900

## 2023-08-25 ENCOUNTER — Encounter: Payer: Self-pay | Admitting: Emergency Medicine

## 2023-08-25 ENCOUNTER — Ambulatory Visit
Admission: EM | Admit: 2023-08-25 | Discharge: 2023-08-25 | Disposition: A | Discharge status: Home / Self Care | Payer: 59

## 2023-08-25 ENCOUNTER — Ambulatory Visit (INDEPENDENT_AMBULATORY_CARE_PROVIDER_SITE_OTHER): Payer: 59

## 2023-08-25 DIAGNOSIS — R0782 Intercostal pain: Secondary | ICD-10-CM

## 2023-08-25 DIAGNOSIS — R0781 Pleurodynia: Secondary | ICD-10-CM

## 2023-08-25 DIAGNOSIS — S299XXA Unspecified injury of thorax, initial encounter: Secondary | ICD-10-CM

## 2023-08-25 MED ORDER — METHOCARBAMOL 500 MG PO TABS: 500.0000 mg | ORAL_TABLET | Freq: Three times a day (TID) | ORAL | 0 refills | Status: AC | PRN

## 2023-08-25 MED ORDER — TRAMADOL HCL 50 MG PO TABS: 50.0000 mg | ORAL_TABLET | Freq: Three times a day (TID) | ORAL | 0 refills | Status: AC | PRN

## 2023-08-25 NOTE — ED Provider Notes (Signed)
[facility]    CSN: [id number] Arrival date & time: 08/25/23  1850      History   Chief Complaint Chief Complaint  Patient presents with   Chest Injury    HPI Douglas Banks is a 37 y.o. male presents to urgent care today with complaint of 1 week history of right side chest wall pain and bruising.  He reports this occurred after he was helping someone move and a large armoire fell against his chest.  He describes the pain as a dull ache but the pain can be sharp and stabbing with taking a deep breath, coughing or sneezing.  The pain does not radiate.  He denies chest pain, chest tightness, shortness of breath or coughing up blood.  He has tried Tylenol, ibuprofen, alternating heat and ice with some improvement in symptoms.  HPI  Past Medical History:  Diagnosis Date   Urinary tract infection     Patient Active Problem List   Diagnosis Date Noted   Urinary tract infection 09/01/2021    Past Surgical History:  Procedure Laterality Date   EXTRACORPOREAL SHOCK WAVE LITHOTRIPSY Left 06/24/2022   Procedure: EXTRACORPOREAL SHOCK WAVE LITHOTRIPSY (ESWL);  Surgeon: Orson Ape, MD;  Location: [facility];  Service: Urology;  Laterality: Left;   NO PAST SURGERIES     VASECTOMY         Home Medications    Prior to Admission medications   Medication Sig Start Date End Date Taking? Authorizing Provider  methocarbamol (ROBAXIN) 500 MG tablet Take 1 tablet (500 mg total) by mouth every 8 (eight) hours as needed for muscle spasms. 08/25/23  Yes Lorre Munroe, NP  tadalafil (CIALIS) 10 MG tablet Take by mouth. 05/30/23  Yes [provider]  traMADol (ULTRAM) 50 MG tablet Take 1 tablet (50 mg total) by mouth every 8 (eight) hours as needed. 08/25/23  Yes Indiyah Paone, Salvadore Oxford, NP    Family History Family History  Problem Relation Age of Onset   Diabetes Mother    Other Father        unknown medical history    Social History Social History   Tobacco Use    Smoking status: Former    Current packs/day: 0.00    Types: Cigarettes    Start date: 01/12/2007    Quit date: 01/12/2017    Years since quitting: 6.6   Smokeless tobacco: Never   Tobacco comments:    1 pack a week  Vaping Use   Vaping status: Some Days   Last attempt to quit: 01/13/2020   Substances: Nicotine, Flavoring  Substance Use Topics   Alcohol use: Not Currently    Comment: social   Drug use: Never     Allergies   Patient has no known allergies.   Review of Systems Review of Systems  Constitutional:  Negative for chills and fever.  Respiratory:  Negative for cough, chest tightness and shortness of breath.   Cardiovascular:  Negative for chest pain.  Musculoskeletal:  Positive for arthralgias and myalgias.  Skin:  Positive for color change.  Neurological:  Negative for dizziness, weakness, light-headedness and headaches.     Physical Exam Triage Vital Signs ED Triage Vitals  Encounter Vitals Group     BP 08/25/23 1904 127/89     Systolic BP Percentile --      Diastolic BP Percentile --      Pulse Rate 08/25/23 1904 94     Resp 08/25/23 1904 18  Temp 08/25/23 1904 98.5 F (36.9 C)     Temp Source 08/25/23 1904 Oral     SpO2 08/25/23 1904 96 %     Weight --      Height --      Head Circumference --      Peak Flow --      Pain Score 08/25/23 1903 3     Pain Loc --      Pain Education --      Exclude from Growth Chart --    No data found.  Updated Vital Signs BP 127/89   Pulse 94   Temp 98.5 F (36.9 C) (Oral)   Resp 18   SpO2 96%       Physical Exam Constitutional:      General: He is not in acute distress.    Appearance: Normal appearance.  HENT:     Head: Normocephalic and atraumatic.  Cardiovascular:     Rate and Rhythm: Normal rate and regular rhythm.     Heart sounds: Normal heart sounds.  Pulmonary:     Effort: Pulmonary effort is normal.     Breath sounds: Normal breath sounds. No wheezing, rhonchi or rales.   Musculoskeletal:        General: Tenderness present. No deformity.     Comments: Pain with palpation over the mid fourth and fifth ribs just above the nipple line but no overt deformity noted  Skin:    General: Skin is warm and dry.     Findings: Bruising present.     Comments: Bruising noted to the right anterior chest just above the nipple  Neurological:     General: No focal deficit present.     Mental Status: He is alert and oriented to person, place, and time.      UC Treatments / Results  Labs   EKG   Radiology Imaging Orders         DG Ribs Unilateral W/Chest Right      Procedures Procedures (including critical care time)  Medications Ordered in UC Medications - No data to display  Initial Impression / Assessment and Plan / UC Course  I have reviewed the triage vital signs and the nursing notes.  Pertinent labs & imaging results that were available during my care of the patient were reviewed by me and considered in my medical decision making (see chart for details).     37 year old male with 1 week history of right sided chest wall pain and bruising status post trauma.  Discussed possibility of rib fracture.  Advised him that we could obtain a chest x-ray but it would not change our management at this time.  Lung sounds are normal.  His wife would like the chest x-ray which is currently pending, advised he would be called with these results.  Recommend to continue Tylenol and ibuprofen OTC.  Will give a limited supply of tramadol and methocarbamol for severe pain.  Reassured him that rib fractures typically take 4 to 6 weeks to heal.  Discussed splinting with coughing.  Discussed why we no longer bind ribs.  Advised him to continue ice and heat as needed.  Advised him to follow-up if symptoms persist or worsen.  Final Clinical Impressions(s) / UC Diagnoses   Final diagnoses:  Injury of chest wall, initial encounter  Rib pain on right side     Discharge  Instructions      You were seen today for right-sided chest wall pain with rib  injury.  We have obtained an x-ray and you will be called with these results.  If you do have a rib fracture, this takes 4 to 6 weeks to heal.  If you have a rib contusion this typically takes 2 to 4 weeks to heal.  We recommend avoiding heavy lifting, splinting with a pillow and anytime you cough or sneeze.  Continue Tylenol or ibuprofen OTC.  Continue alternating heat and ice as tolerated.  I have sent in prescriptions for pain medication and muscle relaxers to take every 8 hours as needed for severe pain.  Please be aware that these medications may cause sedation.  Please follow-up with your PCP if symptoms persist or worsen.     ED Prescriptions     Medication Sig Dispense Auth. Provider   traMADol (ULTRAM) 50 MG tablet Take 1 tablet (50 mg total) by mouth every 8 (eight) hours as needed. 15 tablet Lorre Munroe, NP   methocarbamol (ROBAXIN) 500 MG tablet Take 1 tablet (500 mg total) by mouth every 8 (eight) hours as needed for muscle spasms. 15 tablet Lorre Munroe, NP      I have reviewed the PDMP during this encounter.   Lorre Munroe, NP 08/25/23 1931

## 2023-08-25 NOTE — Discharge Instructions (Addendum)
You were seen today for right-sided chest wall pain with rib injury.  We have obtained an x-ray and you will be called with these results.  If you do have a rib fracture, this takes 4 to 6 weeks to heal.  If you have a rib contusion this typically takes 2 to 4 weeks to heal.  We recommend avoiding heavy lifting, splinting with a pillow and anytime you cough or sneeze.  Continue Tylenol or ibuprofen OTC.  Continue alternating heat and ice as tolerated.  I have sent in prescriptions for pain medication and muscle relaxers to take every 8 hours as needed for severe pain.  Please be aware that these medications may cause sedation.  Please follow-up with your PCP if symptoms persist or worsen.

## 2023-08-25 NOTE — ED Triage Notes (Addendum)
Pt was helping someone move furniture and a armoire slammed into the right side of his chest.
